# Patient Record
Sex: Male | Born: 1960 | Race: White | Hispanic: No | Marital: Married | Smoking: Former smoker
Health system: Southern US, Community
[De-identification: ages and names within clinical notes are randomized; demographics above are authoritative.]

## PROBLEM LIST (undated history)

## (undated) DIAGNOSIS — J628 Pneumoconiosis due to other dust containing silica: Secondary | ICD-10-CM

---

## 2018-08-17 ENCOUNTER — Encounter (HOSPITAL_COMMUNITY): Payer: Self-pay | Admitting: Emergency Medicine

## 2018-08-17 ENCOUNTER — Inpatient Hospital Stay (HOSPITAL_COMMUNITY): Payer: 59

## 2018-08-17 ENCOUNTER — Emergency Department (HOSPITAL_COMMUNITY): Payer: 59

## 2018-08-17 ENCOUNTER — Inpatient Hospital Stay (HOSPITAL_COMMUNITY)
Admission: EM | Admit: 2018-08-17 | Discharge: 2018-09-12 | DRG: 208 | Disposition: E | Payer: 59 | Attending: Pulmonary Disease | Admitting: Pulmonary Disease

## 2018-08-17 ENCOUNTER — Other Ambulatory Visit: Payer: Self-pay

## 2018-08-17 DIAGNOSIS — R0603 Acute respiratory distress: Secondary | ICD-10-CM

## 2018-08-17 DIAGNOSIS — J45909 Unspecified asthma, uncomplicated: Secondary | ICD-10-CM | POA: Diagnosis present

## 2018-08-17 DIAGNOSIS — Z7952 Long term (current) use of systemic steroids: Secondary | ICD-10-CM

## 2018-08-17 DIAGNOSIS — R579 Shock, unspecified: Secondary | ICD-10-CM | POA: Diagnosis present

## 2018-08-17 DIAGNOSIS — R000 Tachycardia, unspecified: Secondary | ICD-10-CM | POA: Diagnosis present

## 2018-08-17 DIAGNOSIS — Z7951 Long term (current) use of inhaled steroids: Secondary | ICD-10-CM | POA: Diagnosis not present

## 2018-08-17 DIAGNOSIS — Z4659 Encounter for fitting and adjustment of other gastrointestinal appliance and device: Secondary | ICD-10-CM

## 2018-08-17 DIAGNOSIS — Z66 Do not resuscitate: Secondary | ICD-10-CM | POA: Diagnosis present

## 2018-08-17 DIAGNOSIS — Z515 Encounter for palliative care: Secondary | ICD-10-CM | POA: Diagnosis present

## 2018-08-17 DIAGNOSIS — Z87891 Personal history of nicotine dependence: Secondary | ICD-10-CM | POA: Diagnosis not present

## 2018-08-17 DIAGNOSIS — J628 Pneumoconiosis due to other dust containing silica: Secondary | ICD-10-CM | POA: Diagnosis present

## 2018-08-17 DIAGNOSIS — Z79899 Other long term (current) drug therapy: Secondary | ICD-10-CM | POA: Diagnosis not present

## 2018-08-17 DIAGNOSIS — I10 Essential (primary) hypertension: Secondary | ICD-10-CM | POA: Diagnosis present

## 2018-08-17 DIAGNOSIS — J189 Pneumonia, unspecified organism: Secondary | ICD-10-CM | POA: Diagnosis present

## 2018-08-17 DIAGNOSIS — R0602 Shortness of breath: Secondary | ICD-10-CM | POA: Diagnosis present

## 2018-08-17 DIAGNOSIS — Z95828 Presence of other vascular implants and grafts: Secondary | ICD-10-CM

## 2018-08-17 DIAGNOSIS — D62 Acute posthemorrhagic anemia: Secondary | ICD-10-CM | POA: Diagnosis present

## 2018-08-17 DIAGNOSIS — J9601 Acute respiratory failure with hypoxia: Secondary | ICD-10-CM

## 2018-08-17 DIAGNOSIS — K219 Gastro-esophageal reflux disease without esophagitis: Secondary | ICD-10-CM | POA: Diagnosis present

## 2018-08-17 DIAGNOSIS — N179 Acute kidney failure, unspecified: Secondary | ICD-10-CM | POA: Diagnosis present

## 2018-08-17 DIAGNOSIS — R042 Hemoptysis: Secondary | ICD-10-CM | POA: Diagnosis present

## 2018-08-17 HISTORY — DX: Pneumoconiosis due to other dust containing silica: J62.8

## 2018-08-17 LAB — PROTIME-INR
INR: 1.24
Prothrombin Time: 15.5 seconds — ABNORMAL HIGH (ref 11.4–15.2)

## 2018-08-17 LAB — BLOOD GAS, ARTERIAL
ACID-BASE EXCESS: 3.9 mmol/L — AB (ref 0.0–2.0)
Acid-base deficit: 1.9 mmol/L (ref 0.0–2.0)
BICARBONATE: 24.6 mmol/L (ref 20.0–28.0)
Bicarbonate: 27.2 mmol/L (ref 20.0–28.0)
DRAWN BY: 441261
Drawn by: 235321
FIO2: 100
LHR: 16 {breaths}/min
O2 Content: 6 L/min
O2 SAT: 85.7 %
O2 Saturation: 90.7 %
PATIENT TEMPERATURE: 98.6
PEEP: 5 cmH2O
PH ART: 7.48 — AB (ref 7.350–7.450)
Patient temperature: 98.6
VT: 510 mL
pCO2 arterial: 36.9 mmHg (ref 32.0–48.0)
pCO2 arterial: 55.2 mmHg — ABNORMAL HIGH (ref 32.0–48.0)
pH, Arterial: 7.271 — ABNORMAL LOW (ref 7.350–7.450)
pO2, Arterial: 58.8 mmHg — ABNORMAL LOW (ref 83.0–108.0)
pO2, Arterial: 58.9 mmHg — ABNORMAL LOW (ref 83.0–108.0)

## 2018-08-17 LAB — COMPREHENSIVE METABOLIC PANEL
ALT: 31 U/L (ref 0–44)
AST: 31 U/L (ref 15–41)
Albumin: 2.2 g/dL — ABNORMAL LOW (ref 3.5–5.0)
Alkaline Phosphatase: 81 U/L (ref 38–126)
Anion gap: 10 (ref 5–15)
BUN: 30 mg/dL — AB (ref 6–20)
CHLORIDE: 106 mmol/L (ref 98–111)
CO2: 23 mmol/L (ref 22–32)
CREATININE: 2.29 mg/dL — AB (ref 0.61–1.24)
Calcium: 7.1 mg/dL — ABNORMAL LOW (ref 8.9–10.3)
GFR calc Af Amer: 34 mL/min — ABNORMAL LOW (ref >60)
GFR, EST NON AFRICAN AMERICAN: 30 mL/min — AB (ref >60)
Glucose, Bld: 113 mg/dL — ABNORMAL HIGH (ref 70–99)
POTASSIUM: 3.9 mmol/L (ref 3.5–5.1)
Sodium: 139 mmol/L (ref 135–145)
TOTAL PROTEIN: 5.5 g/dL — AB (ref 6.5–8.1)
Total Bilirubin: 1.7 mg/dL — ABNORMAL HIGH (ref 0.3–1.2)

## 2018-08-17 LAB — GLUCOSE, CAPILLARY: GLUCOSE-CAPILLARY: 74 mg/dL (ref 70–99)

## 2018-08-17 LAB — CBC WITH DIFFERENTIAL/PLATELET
ABS IMMATURE GRANULOCYTES: 0.47 10*3/uL — AB (ref 0.00–0.07)
BASOS ABS: 0.1 10*3/uL (ref 0.0–0.1)
Basophils Relative: 0 %
Eosinophils Absolute: 0.1 10*3/uL (ref 0.0–0.5)
Eosinophils Relative: 0 %
HEMATOCRIT: 25 % — AB (ref 39.0–52.0)
HEMOGLOBIN: 7.4 g/dL — AB (ref 13.0–17.0)
IMMATURE GRANULOCYTES: 2 %
LYMPHS ABS: 1.9 10*3/uL (ref 0.7–4.0)
LYMPHS PCT: 9 %
MCH: 26.4 pg (ref 26.0–34.0)
MCHC: 29.6 g/dL — ABNORMAL LOW (ref 30.0–36.0)
MCV: 89.3 fL (ref 80.0–100.0)
Monocytes Absolute: 1.9 10*3/uL — ABNORMAL HIGH (ref 0.1–1.0)
Monocytes Relative: 9 %
NEUTROS PCT: 80 %
NRBC: 0 % (ref 0.0–0.2)
Neutro Abs: 16.5 10*3/uL — ABNORMAL HIGH (ref 1.7–7.7)
Platelets: 280 10*3/uL (ref 150–400)
RBC: 2.8 MIL/uL — ABNORMAL LOW (ref 4.22–5.81)
RDW: 14.7 % (ref 11.5–15.5)
WBC: 20.9 10*3/uL — ABNORMAL HIGH (ref 4.0–10.5)

## 2018-08-17 LAB — CBC
HCT: 34.7 % — ABNORMAL LOW (ref 39.0–52.0)
Hemoglobin: 10.5 g/dL — ABNORMAL LOW (ref 13.0–17.0)
MCH: 26.4 pg (ref 26.0–34.0)
MCHC: 30.3 g/dL (ref 30.0–36.0)
MCV: 87.2 fL (ref 80.0–100.0)
PLATELETS: 245 10*3/uL (ref 150–400)
RBC: 3.98 MIL/uL — ABNORMAL LOW (ref 4.22–5.81)
RDW: 14.6 % (ref 11.5–15.5)
WBC: 14.7 10*3/uL — ABNORMAL HIGH (ref 4.0–10.5)
nRBC: 0 % (ref 0.0–0.2)

## 2018-08-17 LAB — TYPE AND SCREEN
ABO/RH(D): B POS
Antibody Screen: NEGATIVE

## 2018-08-17 LAB — MRSA PCR SCREENING: MRSA BY PCR: NEGATIVE

## 2018-08-17 LAB — I-STAT CG4 LACTIC ACID, ED: LACTIC ACID, VENOUS: 1.51 mmol/L (ref 0.5–1.9)

## 2018-08-17 LAB — ABO/RH: ABO/RH(D): B POS

## 2018-08-17 LAB — LACTIC ACID, PLASMA: Lactic Acid, Venous: 1 mmol/L (ref 0.5–1.9)

## 2018-08-17 MED ORDER — MIDAZOLAM HCL 2 MG/2ML IJ SOLN
2.0000 mg | Freq: Once | INTRAMUSCULAR | Status: AC
  Administered 2018-08-17 (×2): 2 mg via INTRAVENOUS

## 2018-08-17 MED ORDER — FENTANYL CITRATE (PF) 100 MCG/2ML IJ SOLN
INTRAMUSCULAR | Status: AC
  Filled 2018-08-17: qty 2

## 2018-08-17 MED ORDER — ALBUTEROL SULFATE (2.5 MG/3ML) 0.083% IN NEBU
10.0000 mg | INHALATION_SOLUTION | Freq: Once | RESPIRATORY_TRACT | Status: AC
  Administered 2018-08-17: 10 mg via RESPIRATORY_TRACT
  Filled 2018-08-17: qty 12

## 2018-08-17 MED ORDER — FENTANYL CITRATE (PF) 100 MCG/2ML IJ SOLN
100.0000 ug | Freq: Once | INTRAMUSCULAR | Status: DC
  Filled 2018-08-17: qty 2

## 2018-08-17 MED ORDER — ASPIRIN 81 MG PO CHEW: 324.0000 mg | CHEWABLE_TABLET | ORAL | Status: DC

## 2018-08-17 MED ORDER — PANTOPRAZOLE SODIUM 40 MG IV SOLR
40.0000 mg | Freq: Every day | INTRAVENOUS | Status: DC
  Administered 2018-08-17 – 2018-08-18 (×2): 40 mg via INTRAVENOUS
  Filled 2018-08-17 (×3): qty 40

## 2018-08-17 MED ORDER — CHLORHEXIDINE GLUCONATE 0.12% ORAL RINSE (MEDLINE KIT)
15.0000 mL | Freq: Two times a day (BID) | OROMUCOSAL | Status: DC
  Administered 2018-08-17 – 2018-08-18 (×3): 15 mL via OROMUCOSAL

## 2018-08-17 MED ORDER — ORAL CARE MOUTH RINSE
15.0000 mL | OROMUCOSAL | Status: DC
  Administered 2018-08-17 – 2018-08-18 (×9): 15 mL via OROMUCOSAL

## 2018-08-17 MED ORDER — SODIUM CHLORIDE 0.9 % IV BOLUS
1000.0000 mL | Freq: Once | INTRAVENOUS | Status: AC
  Administered 2018-08-17: 1000 mL via INTRAVENOUS

## 2018-08-17 MED ORDER — PREDNISONE 5 MG PO TABS
5.0000 mg | ORAL_TABLET | Freq: Every day | ORAL | Status: DC
  Filled 2018-08-17: qty 1

## 2018-08-17 MED ORDER — ASPIRIN 300 MG RE SUPP: 300.0000 mg | RECTAL | Status: DC

## 2018-08-17 MED ORDER — SUCCINYLCHOLINE CHLORIDE 20 MG/ML IJ SOLN
100.0000 mg | Freq: Once | INTRAMUSCULAR | Status: AC
  Administered 2018-08-17: 100 mg via INTRAVENOUS
  Filled 2018-08-17: qty 5

## 2018-08-17 MED ORDER — VANCOMYCIN HCL IN DEXTROSE 1-5 GM/200ML-% IV SOLN
1000.0000 mg | Freq: Once | INTRAVENOUS | Status: DC
  Filled 2018-08-17 (×2): qty 200

## 2018-08-17 MED ORDER — FENTANYL CITRATE (PF) 100 MCG/2ML IJ SOLN
200.0000 ug | Freq: Once | INTRAMUSCULAR | Status: AC
  Administered 2018-08-17: 200 ug via INTRAVENOUS
  Filled 2018-08-17: qty 4

## 2018-08-17 MED ORDER — SODIUM CHLORIDE 0.9 % IV SOLN
2.0000 g | Freq: Once | INTRAVENOUS | Status: AC
  Administered 2018-08-17: 2 g via INTRAVENOUS
  Filled 2018-08-17: qty 2

## 2018-08-17 MED ORDER — FENTANYL 2500MCG IN NS 250ML (10MCG/ML) PREMIX INFUSION
0.0000 ug/h | INTRAVENOUS | Status: DC
  Administered 2018-08-17: 25 ug/h via INTRAVENOUS
  Administered 2018-08-18: 400 ug/h via INTRAVENOUS
  Filled 2018-08-17 (×2): qty 250

## 2018-08-17 MED ORDER — PROPOFOL 1000 MG/100ML IV EMUL
5.0000 ug/kg/min | Freq: Once | INTRAVENOUS | Status: AC
  Administered 2018-08-17: 5 ug/kg/min via INTRAVENOUS
  Filled 2018-08-17: qty 100

## 2018-08-17 MED ORDER — SODIUM CHLORIDE 0.9 % IV SOLN
2.0000 g | Freq: Three times a day (TID) | INTRAVENOUS | Status: DC
  Administered 2018-08-18: 2 g via INTRAVENOUS
  Filled 2018-08-17 (×4): qty 2

## 2018-08-17 MED ORDER — SODIUM CHLORIDE 0.9 % IV SOLN
INTRAVENOUS | Status: DC
  Administered 2018-08-17: via INTRAVENOUS

## 2018-08-17 MED ORDER — NOREPINEPHRINE 4 MG/250ML-% IV SOLN
0.0000 ug/min | INTRAVENOUS | Status: DC
  Administered 2018-08-17: 16 ug/min via INTRAVENOUS
  Administered 2018-08-17 – 2018-08-18 (×3): 10 ug/min via INTRAVENOUS
  Filled 2018-08-17 (×2): qty 250

## 2018-08-17 MED ORDER — MIDAZOLAM HCL 2 MG/2ML IJ SOLN
INTRAMUSCULAR | Status: AC
  Administered 2018-08-17: 2 mg
  Filled 2018-08-17: qty 2

## 2018-08-17 MED ORDER — MIDAZOLAM HCL 2 MG/2ML IJ SOLN
INTRAMUSCULAR | Status: AC
  Administered 2018-08-17: 2 mg via INTRAVENOUS
  Filled 2018-08-17: qty 2

## 2018-08-17 MED ORDER — HYDROGEN PEROXIDE 3 % EX SOLN
CUTANEOUS | Status: AC
  Filled 2018-08-17: qty 473

## 2018-08-17 MED ORDER — LACTATED RINGERS IV BOLUS
1000.0000 mL | Freq: Once | INTRAVENOUS | Status: AC
  Administered 2018-08-17: 1000 mL via INTRAVENOUS

## 2018-08-17 MED ORDER — ETOMIDATE 2 MG/ML IV SOLN
0.3000 mg/kg | Freq: Once | INTRAVENOUS | Status: AC
  Administered 2018-08-17: 20 mg via INTRAVENOUS
  Filled 2018-08-17: qty 20

## 2018-08-17 MED ORDER — PROPOFOL 1000 MG/100ML IV EMUL
INTRAVENOUS | Status: AC
  Administered 2018-08-17: 75 ug/kg/min
  Filled 2018-08-17: qty 100

## 2018-08-17 MED ORDER — FENTANYL BOLUS VIA INFUSION
30.0000 ug | Freq: Once | INTRAVENOUS | Status: AC
  Administered 2018-08-17: 30 ug via INTRAVENOUS
  Filled 2018-08-17: qty 30

## 2018-08-17 MED ORDER — BUDESONIDE 0.25 MG/2ML IN SUSP
0.2500 mg | Freq: Two times a day (BID) | RESPIRATORY_TRACT | Status: DC
  Administered 2018-08-18 (×2): 0.25 mg via RESPIRATORY_TRACT
  Filled 2018-08-17 (×3): qty 2

## 2018-08-17 NOTE — ED Notes (Addendum)
Pt to be intubated at this time by Dr Mechele Collin ICU MD. RRT x 2 at the bedside, ED Provider [place] at bedside

## 2018-08-17 NOTE — H&P (Addendum)
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  At baseline, he has chronic dyspnea due to his silicosis and is on chronic prednisone and bronchodilators.  Is followed by a pulmonologist in [place].  Three weeks ago, he presented to the OSH ED with similar symptoms with intermittent small streaks of sputum for which he was prescribed Augmentin and prednisone 60 mg x 5 days.  His symptoms improved with treatment however he to need to have persistent hemoptysis.  In the last 3 days, he has had fatigue, chills, dizziness and increased volume of bright red bloody sputum, being able to fill up half a cup over 2 hours.  In the ED he was found with tachycardia, tachypnea, and new onset hypoxemia.  He was placed on 6 L O2 via [place].  Pulmonary/critical care consulted for admission for massive hemoptysis.  Past Medical History  Silicosis, asthma, reflux, obstructive sleep apnea Significant Hospital Events   09/03/2018- Admitted to ICU for bronchoscopy  Consults: date of consult/date signed off & final recs:  Pulmonary 11/5  Procedures (surgical and bedside):  Intubation 11/5>>> Bronchoscopy 11/5 Significant Diagnostic Tests:  Bronchoscopy 11/5: Airway inspection performed.  All airways patent.  Mucosa normal.  No evidence of blood or bleeding on left side.  Right middle lobe and right lower lobe with bright red bloody secretions, not able to visualize bleeding site.  Secretions are more prominent at the entry of the right middle lobe.  RML and RLL suctioned with evidence of bloody secretions.  BAL of right middle lobe  performed with 60 cc of saline given and 22 cc collected. Micro Data:  BAL of right middle lobe  Antimicrobials:  Vanc 11/5 Cefepime 11/5>>>  Subjective:  He complains of headaches, dizziness, worsening shortness of breath, hemoptysis, and chest pain associated with cough.  Also has nasal congestion, reflux.  Denies nausea, emesis, abdominal pain.  Reports decreased appetite.  Denies weight loss or night sweats.  Reports decreased urinary frequency and darker urine.  No hematuria or hematochezia.  Objective   Blood pressure (!) 101/47, pulse (!) 103, temperature 100.1 F (37.8 C), temperature source Axillary, resp. rate (!) 22, height _0  (1.676 m), weight 86.2 kg, SpO2 96 %.    Vent Mode: PRVC FiO2 (%):  [100 %] 100 % Set Rate:  [16 bmp-20 bmp] 20 bmp Vt Set:  [500 mL-510 mL] 510 mL PEEP:  [5 EOF12-1 cmH20] 8 cmH20 Plateau Pressure:  [25 cmH20-26 cmH20] 25 cmH20   Intake/Output Summary (Last 24 hours) at 09/04/2018 2052 Last data filed at 09/05/2018 1901 Gross per 24 hour  Intake 1115.84 ml  Output -  Net 1115.84 ml   Filed Weights   08/20/2018 1652  Weight: 86.2 kg    Examination: General: Diaphoretic male in moderate respiratory distress HENT: [place], AT, oropharynx moist with streaks of blood,  Eyes:  EOMI, no scleral icterus Lungs: Diffuse rhonchi bilaterally R>L, no wheezing Cardiovascular: Tachycardic, no M/R/G Abdomen: BS+, NTTP Extremities: Non-pitting lower extremity edema bilaterally, distal pulses palpable Neuro: AAO x4, anxious GU: foley in place   Resolved Hospital Problem list     Assessment & Plan:  Massive hemoptysis Patient with recent respiratory infection associated with hemoptysis that has progressed despite antibiotics and steroid burst.  Presented with new onset hypoxemia and worsening hemoptysis.  Patient with history of silicosis and severe asthma.  Bronchoscopy demonstrated persistent bloody secretions in RML and RLL.  Concerned that his recent  respiratory infection may have caused trauma to cause bleeding however no evidence of active bleeding site on exam. Patient typed and screened. Plan --On mechanical ventilation --Consult IR  --Trend CBC --Strict I/Os --Telemetry --NPO  Acute hypoxemic respiratory failure Hypotension AHRF likely secondary to hemoptysis. Cannot rule out infection. Febrile on arrival to ICU. Hypotension likely secondary to sedatives however cannot rule out septic shock  --On mechanical ventilation. Wean ventilator settings as tolerated.  To be remain intubated for pending procedures. --On propofol and fentanyl gtt --Blood cultures x 2 and urine culture --Follow-up BAL cell count and cultures --Broad spectrum antibiotics --CT Chest when stable  Silicosis --Prednisone 5 mg Thomas per patient --Albuterol nebulizer PRN  Asthma On Breo at home --Pulmicort neb BID  Goals of Care Discussed with patient. He requests NO compressions, NO shocks. He is ok with mechanical ventilation, pressors and medications.  Rodman Pickle, M.D. [facility] Pulmonary/Critical Care Medicine Pager: [phone] After hours pager: [phone]   Disposition / Summary of Today's Plan 08/13/2018   As above    Diet: NPO Pain/Anxiety/Delirium protocol (if indicated): Yes VAP protocol (if indicated): Yes DVT prophylaxis: SCDs GI prophylaxis: PPI (Home) Hyperglycemia protocol: N/A Mobility: Defer Code Status: DNR - NO compressions, NO shocks. Ok with mechanical ventilation, pressors and medications. Family Communication: Olegario Shearer (Wife). She is aware of patient's code status  Labs   CBC: Recent Labs  Lab 09/04/2018 1353  WBC 14.7*  HGB 10.5*  HCT 34.7*  MCV 87.2  PLT 299    Basic Metabolic Panel: No results for input(s): NA, K, CL, CO2, GLUCOSE, BUN, CREATININE, CALCIUM, MG, PHOS in the last 168 hours. GFR: CrCl cannot be calculated (No successful lab value found.). Recent Labs  Lab 09/08/2018 1353 08/16/2018 1611  WBC 14.7*   --   LATICACIDVEN  --  1.51    Liver Function Tests: No results for input(s): AST, ALT, ALKPHOS, BILITOT, PROT, ALBUMIN in the last 168 hours. No results for input(s): LIPASE, AMYLASE in the last 168 hours. No results for input(s): AMMONIA in the last 168 hours.  ABG    Component Value Date/Time   PHART 7.271 (L) 08/13/2018 1908   PCO2ART 55.2 (H) 08/28/2018 1908   PO2ART 58.9 (L) 09/10/2018 1908   HCO3 24.6 08/16/2018 1908   ACIDBASEDEF 1.9 08/30/2018 1908   O2SAT 85.7 09/02/2018 1908     Coagulation Profile: Recent Labs  Lab 08/23/2018 1353  INR 1.24    Cardiac Enzymes: No results for input(s): CKTOTAL, CKMB, CKMBINDEX, TROPONINI in the last 168 hours.  HbA1C: No results found for: HGBA1C  CBG: No results for input(s): GLUCAP in the last 168 hours.   Review of Systems:   Review of Systems  Constitutional: Positive for diaphoresis and malaise/fatigue. Negative for chills, fever and weight loss.  HENT: Positive for congestion and sore throat. Negative for nosebleeds.   Eyes: Negative for blurred vision.  Respiratory: Positive for cough, hemoptysis, sputum production and shortness of breath. Negative for wheezing.   Cardiovascular: Positive for chest pain, leg swelling and PND. Negative for palpitations and orthopnea.  Gastrointestinal: Positive for heartburn. Negative for abdominal pain, diarrhea, nausea and vomiting.  Genitourinary: Positive for frequency.  Musculoskeletal: Negative for  myalgias.  Skin: Negative for rash.  Neurological: Positive for dizziness, sensory change (right lower foot numbness) and headaches. Negative for focal weakness, loss of consciousness and weakness.  Endo/Heme/Allergies: Does not bruise/bleed easily.  Psychiatric/Behavioral: The patient is nervous/anxious.     Past Medical History  He,  has a past medical history of Silicosis ([place]).   Surgical History   History reviewed. No pertinent surgical history.   Social History    Social History   Socioeconomic History  . Marital status: Married    Spouse name: Not on file  . Number of children: Not on file  . Years of education: Not on file  . Highest education level: Not on file  Occupational History  . Not on file  Social Needs  . Financial resource strain: Not on file  . Food insecurity:    Worry: Not on file    Inability: Not on file  . Transportation needs:    Medical: Not on file    Non-medical: Not on file  Tobacco Use  . Smoking status: Former Research scientist (life sciences)  . Smokeless tobacco: Never Used  Substance and Sexual Activity  . Alcohol use: Yes    Comment: Social  . Drug use: Not on file  . Sexual activity: Not on file  Lifestyle  . Physical activity:    Days per week: Not on file    Minutes per session: Not on file  . Stress: Not on file  Relationships  . Social connections:    Talks on phone: Not on file    Gets together: Not on file    Attends religious service: Not on file    Active member of club or organization: Not on file    Attends meetings of clubs or organizations: Not on file    Relationship status: Not on file  . Intimate partner violence:    Fear of current or ex partner: Not on file    Emotionally abused: Not on file    Physically abused: Not on file    Forced sexual activity: Not on file  Other Topics Concern  . Not on file  Social History Narrative  . Not on file  ,  reports that he has quit smoking. He has never used smokeless tobacco. He reports that he drinks alcohol.   Family History   His family history includes Alzheimer's disease in his father; Diabetes in his mother.   Allergies No Known Allergies   Home Medications  Prior to Admission medications   Medication Sig Start Date End Date Taking? Authorizing Provider  albuterol (PROVENTIL) (2.5 MG/3ML) 0.083% nebulizer solution Take 3 mLs by nebulization every 6 (six) hours as needed. 07/24/18  Yes [provider]  BREO ELLIPTA 200-25 MCG/INH AEPB Inhale  1 puff into the lungs Thomas. 08/07/18  Yes [provider]  fluticasone (FLONASE) 50 MCG/ACT nasal spray Place 1 spray into both nostrils Thomas. 08/06/18  Yes [provider]  pantoprazole (PROTONIX) 40 MG tablet Take 40 mg by mouth 2 (two) times Thomas. 07/24/18  Yes [provider]  VENTOLIN HFA 108 (90 Base) MCG/ACT inhaler Inhale 1-2 puffs into the lungs every 4 (four) hours as needed. 08/07/18  Yes [provider]    The patient is critically ill with multiple organ systems failure and requires high complexity decision making for assessment and support, frequent evaluation and titration of therapies, application of advanced monitoring technologies and extensive interpretation of multiple databases.   Critical Care Time devoted to patient care  services described in this note is  120 Minutes. This time reflects time of care of this signee Dr. Rodman Pickle. This critical care time does not reflect procedure time, or teaching time or supervisory time of PA/NP/Med student/Med Resident etc but could involve care discussion time.  Rodman Pickle, M.D. [facility] Pulmonary/Critical Care Medicine. Pager: [phone]. After hours pager: [phone].

## 2018-08-17 NOTE — ED Notes (Signed)
Bed: ZO10 Expected date:  Expected time:  Means of arrival:  Comments: EMS bloody cough

## 2018-08-17 NOTE — ED Provider Notes (Addendum)
[facility]-EMERGENCY DEPT Provider Note   CSN: [id number] Arrival date & time: 31-Aug-2018  1229     History   Chief Complaint Chief Complaint  Patient presents with  . Shortness of Breath    HPI Jacob Thomas is a 57 y.o. male.  HPI 57 yo male with a hx of interstitial lung disease and silica based lung disease presenting with 3 days of worsening SOB and frank hemoptysis. Initially began 3 weeks ago but was treated with augmentin and steroids with some initial improvement. Now worsening. No use of anticoagulants. No hx of cancer. No hx of tobacco abuse. Has a pulmonologist in [place] where he resides. Trying his albuterol at home without improvement in his symptoms. Some chills at home.    History reviewed. No pertinent past medical history.  There are no active problems to display for this patient.   History reviewed. No pertinent surgical history.      Home Medications    Prior to Admission medications   Medication Sig Start Date End Date Taking? Authorizing Provider  albuterol (PROVENTIL) (2.5 MG/3ML) 0.083% nebulizer solution Take 3 mLs by nebulization every 6 (six) hours as needed. 07/24/18   [provider]  amLODipine (NORVASC) 5 MG tablet Take 5 mg by mouth daily. 06/25/18   [provider]  amoxicillin-clavulanate (AUGMENTIN) 875-125 MG tablet Take 1 tablet by mouth every 12 (twelve) hours. 07/31/18   [provider]  BREO ELLIPTA 200-25 MCG/INH AEPB Inhale 1 puff into the lungs daily. 08/07/18   [provider]  fluticasone (FLONASE) 50 MCG/ACT nasal spray Place 1 spray into both nostrils daily. 08/06/18   [provider]  pantoprazole (PROTONIX) 40 MG tablet Take 40 mg by mouth 2 (two) times daily. 07/24/18   [provider]  VENTOLIN HFA 108 (90 Base) MCG/ACT inhaler Inhale 1-2 puffs into the lungs every 4 (four) hours as needed. 08/07/18   [provider]    Family  History History reviewed. No pertinent family history.  Social History Social History   Tobacco Use  . Smoking status: Former Games developer  . Smokeless tobacco: Never Used  Substance Use Topics  . Alcohol use: Not on file  . Drug use: Not on file     Allergies   Patient has no known allergies.   Review of Systems Review of Systems  All other systems reviewed and are negative.    Physical Exam Updated Vital Signs BP 135/81   Pulse (!) 137   Temp 98.9 F (37.2 C) (Oral)   Resp (!) 28   SpO2 95%   Physical Exam  Constitutional: He is oriented to person, place, and time. He appears well-developed and well-nourished.  HENT:  Head: Normocephalic and atraumatic.  Eyes: EOM are normal.  Neck: Normal range of motion.  Cardiovascular: Regular rhythm, normal heart sounds and intact distal pulses.  tachycardia  Pulmonary/Chest: He is in respiratory distress. He has no wheezes.  Rhonchi throughout. tacypnea  Abdominal: Soft. He exhibits no distension. There is no tenderness.  Musculoskeletal: Normal range of motion.  Neurological: He is alert and oriented to person, place, and time.  Skin: Skin is warm and dry.  Psychiatric: He has a normal mood and affect. Judgment normal.  Nursing note and vitals reviewed.    ED Treatments / Results  Labs (all labs ordered are listed, but only abnormal results are displayed) Labs Reviewed  CBC - Abnormal; Notable for the following components:      Result  Value   WBC 14.7 (*)    RBC 3.98 (*)    Hemoglobin 10.5 (*)    HCT 34.7 (*)    All other components within normal limits  PROTIME-INR - Abnormal; Notable for the following components:   Prothrombin Time 15.5 (*)    All other components within normal limits  CULTURE, BLOOD (ROUTINE X 2)  CULTURE, BLOOD (ROUTINE X 2)  BLOOD GAS, ARTERIAL  I-STAT CG4 LACTIC ACID, ED  TYPE AND SCREEN    EKG EKG Interpretation  Date/Time:  Tuesday August 17 2018 13:48:02 EST Ventricular Rate:   134 PR Interval:    QRS Duration: 82 QT Interval:  291 QTC Calculation: 435 R Axis:   46 Text Interpretation:  Sinus tachycardia Left atrial enlargement No old tracing to compare Confirmed by Azalia Bilis ([id number]) on 08/26/2018 3:17:57 PM   Radiology Dg Chest Portable 1 View  Result Date: 08/26/2018 CLINICAL DATA:  Shortness of breath EXAM: PORTABLE CHEST 1 VIEW COMPARISON:  None. FINDINGS: The heart size and mediastinal contours are within normal limits. Consolidation of bilateral lung bases are noted. No pleural effusion is noted. There is no pulmonary edema. The visualized skeletal structures are unremarkable. IMPRESSION: Bibasilar pneumonias. Electronically Signed   By: Sherian Rein M.D.   On: 08/25/2018 15:13    Procedures .Critical Care Performed by: Azalia Bilis, MD Authorized by: Azalia Bilis, MD    CRITICAL CARE Performed by: Azalia Bilis Total critical care time: 35 minutes Critical care time was exclusive of separately billable procedures and treating other patients. Critical care was necessary to treat or prevent imminent or life-threatening deterioration. Critical care was time spent personally by me on the following activities: development of treatment plan with patient and/or surrogate as well as nursing, discussions with consultants, evaluation of patient's response to treatment, examination of patient, obtaining history from patient or surrogate, ordering and performing treatments and interventions, ordering and review of laboratory studies, ordering and review of radiographic studies, pulse oximetry and re-evaluation of patient's condition.    INTUBATION Required items: required blood products, implants, devices, and special equipment available Patient identity confirmed: provided demographic data and hospital-assigned identification number Time out: Immediately prior to procedure a "time out" was called to verify the correct patient, procedure, equipment,  support staff and site/side marked as required. Indications: acute respiratory failure Intubation method: Glidescope Laryngoscopy  Preoxygenation: BVM Sedatives: Etomidate Paralytic: Succinylcholine Tube Size: 8-0 cuffed Post-procedure assessment: chest rise and ETCO2 monitor Breath sounds: equal and absent over the epigastrium Tube secured with: ETT holder Chest x-ray interpreted by radiologist and me. Chest x-ray findings: endotracheal tube in appropriate position Patient tolerated the procedure well with no immediate complications.       Medications Ordered in ED Medications  sodium chloride 0.9 % bolus 1,000 mL (has no administration in time range)  albuterol (PROVENTIL) (2.5 MG/3ML) 0.083% nebulizer solution 10 mg (has no administration in time range)  vancomycin (VANCOCIN) IVPB 1000 mg/200 mL premix (has no administration in time range)  ceFEPIme (MAXIPIME) 2 g in sodium chloride 0.9 % 100 mL IVPB (has no administration in time range)     Initial Impression / Assessment and Plan / ED Course  I have reviewed the triage vital signs and the nursing notes.  Pertinent labs & imaging results that were available during my care of the patient were reviewed by me and considered in my medical decision making (see chart for details).     Failed outpatient treatment. PNA in the setting of  ILD. Hypoxia requiring 5 liters [place]. abx now. Blood cultures now. Advanced lung disease. PCCM consultation in the ER for pulmonary reasons in addition to likely need for ICU placement. Blood cultures obtained. Fluids now for likely volume depletion. Bronchodilators now. May benefit from CT imaging. Will defer to PCCM. ?bronchoscopy during this hospitalization. No indication for emergent bronch at this time  Final Clinical Impressions(s) / ED Diagnoses   Final diagnoses:  None    ED Discharge Orders    None       Azalia Bilis, MD 09/11/2018 1521    Azalia Bilis, MD 08/18/18 [phone]

## 2018-08-17 NOTE — Progress Notes (Signed)
Late entry- pt received from ED after emergent intubation at 1800. On arrival to unit pt sedated on Propofol and on Levophed drip, NS infusing wide open.  Dr Everardo All in stat bronch done at bedside.  Fent. Drip started at 25 mcg/ kg/min for sedation for bronch.  Washing done .

## 2018-08-17 NOTE — ED Notes (Signed)
TOTAL OF 2, 2MG  DOSES OF VERSED GIVEN FENTANYL

## 2018-08-17 NOTE — ED Triage Notes (Addendum)
Patient arrived by EMS from Urgent Care. Pt c/o SOB. Pt has been coughing up blood for three days which started three weeks ago then went away. Pt has hx of cilicosis and asthma.   Pt only complains of pain when coughing.   Pt is actively coughing up blood per EMS. Pt alert and oriented x4.  Pt is on a non-rebreather.  HR 140, SpO2 97%, BP 136/84.

## 2018-08-17 NOTE — Procedures (Signed)
PCCM Video Bronchoscopy Procedure Note  The patient was informed of the risks (including but not limited to bleeding, infection, respiratory failure, lung injury, tooth/oral injury) and benefits of the procedure and gave consent, see chart.  Indication: Massive Hemoptysis  Post Procedure Diagnosis: Massive Hemoptysis  Location: [facility] ICU  Condition pre procedure: Critical  Medications for procedure: Fentanyl and Propofol gtt  Procedure description: The bronchoscope was introduced through the endotracheal tube and passed to the bilateral lungs to the level of the subsegmental bronchi throughout the tracheobronchial tree.  Airway exam revealed:  All airways patent.  Mucosa normal.  No evidence of blood or bleeding on left side.  Right middle lobe and right lower lobe with bright red bloody secretions, not able to visualize bleeding site.  Secretions are more prominent at the entry of the right middle lobe.  RML and RLL suctioned with evidence of bloody secretions.  BAL of right middle lobe performed with 60 cc of saline given and 22 cc collected. After airways suctioned and cleared, bronchoscopy was slowly removed.  Procedures performed: Diagnostic bronchoscopy with bal  Specimens sent: cell count, respiratory culture, fungal culture, AFB  Condition post procedure: Critical  EBL: 38LH  Complications: During procedure, patient had hypotension with SBP 60s. Bronchoscope was removed and IV fluid bolus and Levophed was started. Pressures normalized after 5 minutes and bronchoscope was re-introduced. Pressures remained adequate for remainder of procedure and levophed was weaned to 54mg.

## 2018-08-17 NOTE — ED Notes (Signed)
ED TO INPATIENT HANDOFF REPORT  Name/Age/Gender Jacob Thomas 57 y.o. male  Code Status    Code Status Orders  (From admission, onward)         Start     Ordered   08/16/2018 1717  Full code  Continuous     09/09/2018 1716        Code Status History    This patient has a current code status but no historical code status.      Home/SNF/Other Home  Chief Complaint short of breath   Level of Care/Admitting Diagnosis ED Disposition    ED Disposition Condition Comment   Admit  Hospital Area: [facility] [[id number]]  Level of Care: ICU [6]  Diagnosis: Massive hemoptysis [730442]  Admitting Physician: Everardo All [facility] [[id number]]  Attending Physician: Luciano Cutter [[id number]]  Estimated length of stay: 5 - 7 days  Certification:: I certify this patient will need inpatient services for at least 2 midnights  PT Class (Do Not Modify): Inpatient [101]  PT Acc Code (Do Not Modify): Private [1]       Medical History History reviewed. No pertinent past medical history.  Allergies No Known Allergies  IV Location/Drains/Wounds Patient Lines/Drains/Airways Status   Active Line/Drains/Airways    Name:   Placement date:   Placement time:   Site:   Days:   Peripheral IV 09/03/2018 Left Antecubital   08/16/2018    1613    Antecubital   less than 1   Airway 8 mm   09/11/2018    1705     less than 1          Labs/Imaging Results for orders placed or performed during the hospital encounter of 09/03/2018 (from the past 48 hour(s))  Type and screen [facility]     Status: None (Preliminary result)   Collection Time: 09/05/2018  1:49 PM  Result Value Ref Range   ABO/RH(D) PENDING    Antibody Screen PENDING    Sample Expiration      08/20/2018 Performed at [facility], [address], [place], [place] [address]   CBC     Status: Abnormal   Collection Time: 08/28/2018  1:53 PM  Result Value Ref Range   WBC 14.7 (H) 4.0 - 10.5  K/uL   RBC 3.98 (L) 4.22 - 5.81 MIL/uL   Hemoglobin 10.5 (L) 13.0 - 17.0 g/dL   HCT 82.9 (L) 56.2 - 13.0 %   MCV 87.2 80.0 - 100.0 fL   MCH 26.4 26.0 - 34.0 pg   MCHC 30.3 30.0 - 36.0 g/dL   RDW 86.5 78.4 - 69.6 %   Platelets 245 150 - 400 K/uL   nRBC 0.0 0.0 - 0.2 %    Comment: Performed at [facility], [address], [place], [place] [address]  Protime-INR     Status: Abnormal   Collection Time: 08/21/2018  1:53 PM  Result Value Ref Range   Prothrombin Time 15.5 (H) 11.4 - 15.2 seconds   INR 1.24     Comment: Performed at [facility], [address] [place], [place] [address]  Blood gas, arterial [facility] & [facility] ONLY)     Status: Abnormal   Collection Time: 08/16/2018  3:18 PM  Result Value Ref Range   O2 Content 6.0 L/min   Delivery systems NASAL CANNULA    pH, Arterial 7.480 (H) 7.350 - 7.450   pCO2 arterial 36.9 32.0 - 48.0 mmHg  pO2, Arterial 58.8 (L) 83.0 - 108.0 mmHg   Bicarbonate 27.2 20.0 - 28.0 mmol/L   Acid-Base Excess 3.9 (H) 0.0 - 2.0 mmol/L   O2 Saturation 90.7 %   Patient temperature 98.6    Collection site LEFT RADIAL    Drawn by [id number]    Sample type ARTERIAL DRAW    Allens test (pass/fail) PASS PASS    Comment: Performed at [facility], [address] [place], [place] [address]  I-Stat CG4 Lactic Acid, ED     Status: None   Collection Time: 09/10/18  4:11 PM  Result Value Ref Range   Lactic Acid, Venous 1.51 0.5 - 1.9 mmol/L   Dg Chest Portable 1 View  Result Date: 09-10-2018 CLINICAL DATA:  Shortness of breath EXAM: PORTABLE CHEST 1 VIEW COMPARISON:  None. FINDINGS: The heart size and mediastinal contours are within normal limits. Consolidation of bilateral lung bases are noted. No pleural effusion is noted. There is no pulmonary edema. The visualized skeletal structures are unremarkable. IMPRESSION: Bibasilar pneumonias. Electronically Signed   By: Sherian Rein M.D.   On: 2018/09/10 15:13    Pending  Labs Unresulted Labs (From admission, onward)    Start     Ordered   09/10/18 1715  HIV antibody (Routine Testing)  Once,   R     09-10-2018 1716   September 10, 2018 1508  Blood culture (routine x 2)  BLOOD CULTURE X 2,   STAT     Sep 10, 2018 1508          Vitals/Pain Today's Vitals   September 10, 2018 1700 Sep 10, 2018 1701 September 10, 2018 1720 09-10-18 1730  BP: (!) 153/85 (!) 153/85 (!) 167/96 104/70  Pulse: (!) 135 (!) 137 (!) 153 (!) 139  Resp: (!) 27 (!) 21 (!) 42 20  Temp:      TempSrc:      SpO2: 98% 99% 96% 96%  Weight:      PainSc:        Isolation Precautions No active isolations  Medications Medications  vancomycin (VANCOCIN) IVPB 1000 mg/200 mL premix (has no administration in time range)  aspirin chewable tablet 324 mg (has no administration in time range)    Or  aspirin suppository 300 mg (has no administration in time range)  fentaNYL (SUBLIMAZE) injection 100 mcg (has no administration in time range)  fentaNYL in NS (23mcg/ml) infusion-PREMIX (has no administration in time range)  sodium chloride 0.9 % bolus 1,000 mL (1,000 mLs Intravenous New Bag/Given 09/10/2018 1615)  albuterol (PROVENTIL) (2.5 MG/3ML) 0.083% nebulizer solution 10 mg (10 mg Nebulization Given 2018-09-10 1522)  ceFEPIme (MAXIPIME) 2 g in sodium chloride 0.9 % 100 mL IVPB ( Intravenous Paused 09/10/2018 1624)  etomidate (AMIDATE) injection 25.86 mg (20 mg Intravenous Given 09-10-18 1703)  succinylcholine (ANECTINE) injection 100 mg (100 mg Intravenous Given September 10, 2018 1703)  propofol (DIPRIVAN) 1000 MG/100ML infusion (80 mcg/kg/min  86.2 kg Intravenous Rate/Dose Verify September 10, 2018 1735)  midazolam (VERSED) 2 MG/2ML injection (2 mg  Given September 10, 2018 1724)  fentaNYL (SUBLIMAZE) injection 200 mcg (200 mcg Intravenous Given Sep 10, 2018 1725)  midazolam (VERSED) injection 2 mg (2 mg Intravenous Given 2018/09/10 1729)    Mobility walks

## 2018-08-17 NOTE — ED Notes (Signed)
Bed: WU98 Expected date:  Expected time:  Means of arrival:  Comments: EMS recently d/c for sepsis, increased WBC

## 2018-08-18 ENCOUNTER — Encounter (HOSPITAL_COMMUNITY): Payer: Self-pay | Admitting: Anesthesiology

## 2018-08-18 ENCOUNTER — Inpatient Hospital Stay (HOSPITAL_COMMUNITY): Payer: 59

## 2018-08-18 ENCOUNTER — Encounter (HOSPITAL_COMMUNITY): Payer: Self-pay | Admitting: Radiology

## 2018-08-18 DIAGNOSIS — N179 Acute kidney failure, unspecified: Secondary | ICD-10-CM

## 2018-08-18 DIAGNOSIS — J9601 Acute respiratory failure with hypoxia: Secondary | ICD-10-CM

## 2018-08-18 DIAGNOSIS — R042 Hemoptysis: Secondary | ICD-10-CM

## 2018-08-18 LAB — CBC WITH DIFFERENTIAL/PLATELET
ABS IMMATURE GRANULOCYTES: 0.12 10*3/uL — AB (ref 0.00–0.07)
Basophils Absolute: 0.1 10*3/uL (ref 0.0–0.1)
Basophils Relative: 0 %
Eosinophils Absolute: 0.2 10*3/uL (ref 0.0–0.5)
Eosinophils Relative: 1 %
HCT: 28.4 % — ABNORMAL LOW (ref 39.0–52.0)
Hemoglobin: 8.5 g/dL — ABNORMAL LOW (ref 13.0–17.0)
Immature Granulocytes: 1 %
Lymphocytes Relative: 13 %
Lymphs Abs: 1.8 10*3/uL (ref 0.7–4.0)
MCH: 26.8 pg (ref 26.0–34.0)
MCHC: 29.9 g/dL — ABNORMAL LOW (ref 30.0–36.0)
MCV: 89.6 fL (ref 80.0–100.0)
MONO ABS: 0.8 10*3/uL (ref 0.1–1.0)
Monocytes Relative: 6 %
NEUTROS ABS: 10.7 10*3/uL — AB (ref 1.7–7.7)
NEUTROS PCT: 79 %
Platelets: 217 10*3/uL (ref 150–400)
RBC: 3.17 MIL/uL — AB (ref 4.22–5.81)
RDW: 14.7 % (ref 11.5–15.5)
WBC: 13.6 10*3/uL — AB (ref 4.0–10.5)
nRBC: 0 % (ref 0.0–0.2)

## 2018-08-18 LAB — BASIC METABOLIC PANEL
ANION GAP: 11 (ref 5–15)
Anion gap: 4 — ABNORMAL LOW (ref 5–15)
BUN: 26 mg/dL — AB (ref 6–20)
BUN: 29 mg/dL — ABNORMAL HIGH (ref 6–20)
CHLORIDE: 105 mmol/L (ref 98–111)
CO2: 26 mmol/L (ref 22–32)
CO2: 21 mmol/L — AB (ref 22–32)
CREATININE: 2.21 mg/dL — AB (ref 0.61–1.24)
Calcium: 7.5 mg/dL — ABNORMAL LOW (ref 8.9–10.3)
Calcium: 7.4 mg/dL — ABNORMAL LOW (ref 8.9–10.3)
Chloride: 110 mmol/L (ref 98–111)
Creatinine, Ser: 2.34 mg/dL — ABNORMAL HIGH (ref 0.61–1.24)
GFR calc Af Amer: 36 mL/min — ABNORMAL LOW (ref >60)
GFR calc non Af Amer: 31 mL/min — ABNORMAL LOW (ref >60)
GFR calc non Af Amer: 29 mL/min — ABNORMAL LOW (ref >60)
GFR, EST AFRICAN AMERICAN: 34 mL/min — AB (ref >60)
Glucose, Bld: 89 mg/dL (ref 70–99)
Glucose, Bld: 98 mg/dL (ref 70–99)
POTASSIUM: 4.1 mmol/L (ref 3.5–5.1)
Potassium: 4.7 mmol/L (ref 3.5–5.1)
SODIUM: 137 mmol/L (ref 135–145)
Sodium: 140 mmol/L (ref 135–145)

## 2018-08-18 LAB — POCT I-STAT 3, ART BLOOD GAS (G3+)
ACID-BASE DEFICIT: 7 mmol/L — AB (ref 0.0–2.0)
Acid-base deficit: 6 mmol/L — ABNORMAL HIGH (ref 0.0–2.0)
BICARBONATE: 24 mmol/L (ref 20.0–28.0)
Bicarbonate: 21.2 mmol/L (ref 20.0–28.0)
O2 Saturation: 83 %
O2 Saturation: 80 %
PCO2 ART: 75.7 mmHg — AB (ref 32.0–48.0)
PH ART: 7.112 — AB (ref 7.350–7.450)
PH ART: 7.154 — AB (ref 7.350–7.450)
PO2 ART: 68 mmHg — AB (ref 83.0–108.0)
Patient temperature: 99.7
Patient temperature: 37.4
TCO2: 26 mmol/L (ref 22–32)
TCO2: 23 mmol/L (ref 22–32)
pCO2 arterial: 60.6 mmHg — ABNORMAL HIGH (ref 32.0–48.0)
pO2, Arterial: 59 mmHg — ABNORMAL LOW (ref 83.0–108.0)

## 2018-08-18 LAB — ABO/RH: ABO/RH(D): B POS

## 2018-08-18 LAB — PROTIME-INR
INR: 1.39
INR: 1.39
PROTHROMBIN TIME: 16.9 s — AB (ref 11.4–15.2)
PROTHROMBIN TIME: 16.9 s — AB (ref 11.4–15.2)

## 2018-08-18 LAB — TYPE AND SCREEN
ABO/RH(D): B POS
Antibody Screen: NEGATIVE

## 2018-08-18 LAB — GLUCOSE, CAPILLARY
GLUCOSE-CAPILLARY: 92 mg/dL (ref 70–99)
GLUCOSE-CAPILLARY: 63 mg/dL — AB (ref 70–99)
GLUCOSE-CAPILLARY: 104 mg/dL — AB (ref 70–99)
Glucose-Capillary: 73 mg/dL (ref 70–99)

## 2018-08-18 LAB — APTT: APTT: 35 s (ref 24–36)

## 2018-08-18 LAB — HIV ANTIBODY (ROUTINE TESTING W REFLEX): HIV SCREEN 4TH GENERATION: NONREACTIVE

## 2018-08-18 LAB — LACTIC ACID, PLASMA: Lactic Acid, Venous: 0.7 mmol/L (ref 0.5–1.9)

## 2018-08-18 MED ORDER — CISATRACURIUM BOLUS VIA INFUSION: 20.0000 mg | Freq: Once | INTRAVENOUS | Status: DC

## 2018-08-18 MED ORDER — ACETAMINOPHEN 325 MG PO TABS: 650.0000 mg | ORAL_TABLET | Freq: Four times a day (QID) | ORAL | Status: DC | PRN

## 2018-08-18 MED ORDER — MIDAZOLAM BOLUS VIA INFUSION
1.0000 mg | INTRAVENOUS | Status: DC | PRN
  Filled 2018-08-18: qty 2

## 2018-08-18 MED ORDER — VANCOMYCIN HCL 10 G IV SOLR
1250.0000 mg | INTRAVENOUS | Status: DC
  Filled 2018-08-18: qty 1250

## 2018-08-18 MED ORDER — ARTIFICIAL TEARS OPHTHALMIC OINT: 1.0000 "application " | TOPICAL_OINTMENT | Freq: Three times a day (TID) | OPHTHALMIC | Status: DC

## 2018-08-18 MED ORDER — SODIUM CHLORIDE 0.9 % IV BOLUS
1000.0000 mL | Freq: Once | INTRAVENOUS | Status: AC
  Administered 2018-08-18: 1000 mL via INTRAVENOUS

## 2018-08-18 MED ORDER — POLYVINYL ALCOHOL 1.4 % OP SOLN
1.0000 [drp] | Freq: Four times a day (QID) | OPHTHALMIC | Status: DC | PRN
  Filled 2018-08-18: qty 15

## 2018-08-18 MED ORDER — DIPHENHYDRAMINE HCL 50 MG/ML IJ SOLN: 25.0000 mg | INTRAMUSCULAR | Status: DC | PRN

## 2018-08-18 MED ORDER — MIDAZOLAM HCL 2 MG/2ML IJ SOLN: 4.0000 mg | Freq: Once | INTRAMUSCULAR | Status: DC

## 2018-08-18 MED ORDER — MORPHINE BOLUS VIA INFUSION
5.0000 mg | INTRAVENOUS | Status: DC | PRN
  Filled 2018-08-18: qty 5

## 2018-08-18 MED ORDER — SODIUM CHLORIDE 0.9 % IV SOLN
2.0000 mg/h | INTRAVENOUS | Status: DC
  Administered 2018-08-18: 4 mg/h via INTRAVENOUS
  Administered 2018-08-18: 6 mg/h via INTRAVENOUS
  Filled 2018-08-18 (×2): qty 10

## 2018-08-18 MED ORDER — MORPHINE 100MG IN NS 100ML (1MG/ML) PREMIX INFUSION
0.0000 mg/h | INTRAVENOUS | Status: DC
  Administered 2018-08-18: 10 mg/h via INTRAVENOUS
  Filled 2018-08-18: qty 100

## 2018-08-18 MED ORDER — SODIUM CHLORIDE 0.9 % IV SOLN
0.0000 mg/h | INTRAVENOUS | Status: DC
  Administered 2018-08-18: 4 mg/h via INTRAVENOUS
  Filled 2018-08-18: qty 10

## 2018-08-18 MED ORDER — FENTANYL BOLUS VIA INFUSION
50.0000 ug | INTRAVENOUS | Status: DC | PRN
  Filled 2018-08-18: qty 50

## 2018-08-18 MED ORDER — SODIUM CHLORIDE 0.9 % IV SOLN: INTRAVENOUS | Status: DC | PRN

## 2018-08-18 MED ORDER — FENTANYL CITRATE (PF) 100 MCG/2ML IJ SOLN: 100.0000 ug | Freq: Once | INTRAMUSCULAR | Status: DC | PRN

## 2018-08-18 MED ORDER — ACETAMINOPHEN 160 MG/5ML PO SOLN
650.0000 mg | Freq: Four times a day (QID) | ORAL | Status: DC | PRN
  Administered 2018-08-18: 650 mg
  Filled 2018-08-18: qty 20.3

## 2018-08-18 MED ORDER — ALBUTEROL SULFATE (2.5 MG/3ML) 0.083% IN NEBU: 2.5000 mg | INHALATION_SOLUTION | RESPIRATORY_TRACT | Status: DC | PRN

## 2018-08-18 MED ORDER — METHYLPREDNISOLONE SODIUM SUCC 125 MG IJ SOLR
40.0000 mg | Freq: Two times a day (BID) | INTRAMUSCULAR | Status: DC
  Administered 2018-08-18: 40 mg via INTRAVENOUS
  Filled 2018-08-18: qty 2

## 2018-08-18 MED ORDER — NOREPINEPHRINE 4 MG/250ML-% IV SOLN
INTRAVENOUS | Status: AC
  Filled 2018-08-18: qty 250

## 2018-08-18 MED ORDER — MIDAZOLAM HCL 2 MG/2ML IJ SOLN: 2.0000 mg | Freq: Once | INTRAMUSCULAR | Status: DC | PRN

## 2018-08-18 MED ORDER — CISATRACURIUM BESYLATE (PF) 200 MG/20ML IV SOLN
0.2000 mg/kg | Freq: Once | INTRAVENOUS | Status: AC
  Administered 2018-08-18: 20 mg via INTRAVENOUS
  Filled 2018-08-18 (×2): qty 20

## 2018-08-18 MED ORDER — FENTANYL 2500MCG IN NS 250ML (10MCG/ML) PREMIX INFUSION
100.0000 ug/h | INTRAVENOUS | Status: DC
  Administered 2018-08-18: 200 ug/h via INTRAVENOUS
  Filled 2018-08-18: qty 250

## 2018-08-18 MED ORDER — MIDAZOLAM HCL 2 MG/2ML IJ SOLN
INTRAMUSCULAR | Status: AC
  Administered 2018-08-18: 4 mg
  Filled 2018-08-18: qty 4

## 2018-08-18 MED ORDER — GLYCOPYRROLATE 1 MG PO TABS: 1.0000 mg | ORAL_TABLET | ORAL | Status: DC | PRN

## 2018-08-18 MED ORDER — SODIUM CHLORIDE 0.9 % IV SOLN
20.0000 ug | Freq: Once | INTRAVENOUS | Status: AC
  Administered 2018-08-18: 20 ug via INTRAVENOUS
  Filled 2018-08-18: qty 5

## 2018-08-18 MED ORDER — MORPHINE SULFATE (PF) 2 MG/ML IV SOLN: 2.0000 mg | INTRAVENOUS | Status: DC | PRN

## 2018-08-18 MED ORDER — SODIUM CHLORIDE 0.9 % IV SOLN
2.0000 g | INTRAVENOUS | Status: DC
  Filled 2018-08-18: qty 2

## 2018-08-18 MED ORDER — FENTANYL 2500MCG IN NS 250ML (10MCG/ML) PREMIX INFUSION: 25.0000 ug/h | INTRAVENOUS | Status: DC

## 2018-08-18 MED ORDER — ACETAMINOPHEN 650 MG RE SUPP: 650.0000 mg | Freq: Four times a day (QID) | RECTAL | Status: DC | PRN

## 2018-08-18 MED ORDER — DEXTROSE 5 % IV SOLN: INTRAVENOUS | Status: DC

## 2018-08-18 MED ORDER — GLYCOPYRROLATE 0.2 MG/ML IJ SOLN: 0.2000 mg | INTRAMUSCULAR | Status: DC | PRN

## 2018-08-18 MED ORDER — MIDAZOLAM BOLUS VIA INFUSION
2.0000 mg | INTRAVENOUS | Status: DC | PRN
  Filled 2018-08-18: qty 2

## 2018-08-18 MED ORDER — SODIUM CHLORIDE 0.9 % IV SOLN
3.0000 ug/kg/min | INTRAVENOUS | Status: DC
  Filled 2018-08-18: qty 20

## 2018-08-18 MED ORDER — IPRATROPIUM-ALBUTEROL 0.5-2.5 (3) MG/3ML IN SOLN
3.0000 mL | Freq: Four times a day (QID) | RESPIRATORY_TRACT | Status: DC
  Administered 2018-08-18 (×4): 3 mL via RESPIRATORY_TRACT
  Filled 2018-08-18 (×4): qty 3

## 2018-08-18 NOTE — Progress Notes (Signed)
Anesthesiology Progress/Procedure: R bronchial blocker placement  08:41-08:54  Called by Dr. Alvia Grove for assistance with lung isolation in intubated patient with R lung hemorrhage. Dr. Alvia Grove is requesting R lung isolation. Pt is obese, tachycardic HR 140, BP 84/53, O2 sat 90% on 100% vent support. 150cc blood aspirated from ETT.   R bronchial blocker placed easily, with Dr. Alvia Grove assisting with fiberoptic bronchoscopic guidance. Bronchial blocker cuff inflated under direct vision, R lung isolated.     VSS, pt stable.  Sandford Craze, MD  [phone]

## 2018-08-18 NOTE — Procedures (Signed)
Bronchoscopy Procedure Note Jacob Thomas [id number] 06/20/1961  Procedure: Bronchoscopy Indications: Massive hemoptysis  Procedure Details Consent: Risks of procedure as well as the alternatives and risks of each were explained to the (patient/caregiver).  Consent for procedure obtained. Time Out: Verified patient identification, verified procedure, site/side was marked, verified correct patient position, special equipment/implants available, medications/allergies/relevent history reviewed, required imaging and test results available.  Performed  In preparation for procedure, patient was given 100% FiO2 and bronchoscope lubricated. Sedation: Benzodiazepines and Muscle relaxants  Airway entered and the following bronchi were examined: Main trachea    Fresh blood noted coming up from the right mainstem bronchus and spilling over into the left side.  Blood suctioned out of the main trachea.  Unable to advance scope further given severe desaturations. We were able to place a right-sided endobronchial blocker with the help of anesthesia.  Position of balloon in right main stem confirmed with the bronchoscope visualization  Evaluation Hemodynamic Status: Transient hypotension treated with fluid; O2 sats: Persistent desats to 70s Patient's Current Condition: unstable Specimens:  None  The patient is critically ill with multiple organ system failure and requires high complexity decision making for assessment and support, frequent evaluation and titration of therapies, advanced monitoring, review of radiographic studies and interpretation of complex data.   Critical Care Time devoted to patient care services, exclusive of separately billable procedures, described in this note is 35 minutes.   Chilton Greathouse MD [facility] Pager [phone] If no answer or after 3pm call: [phone] 08/18/2018, 9:54 AM

## 2018-08-18 NOTE — Plan of Care (Addendum)
  Interdisciplinary Goals of Care Family Meeting   Date carried out:: 08/18/2018  Location of the meeting: Conference room  Member's involved: Physician, Bedside Registered Nurse and Family Member or next of kin  Durable Power of Attorney or acting medical decision maker:   Wife Jacob Thomas and sister were present  Discussion: We discussed goals of care for [name] .  Per family he has severe interstitial lung disease and a declining course over the past few years with frequent exacerbations, poor quality of life. He told his wife 1 year ago that he would wants to be DNR  Explained to family to stop bleeding he would need IR embolization with high risk of kidney failure.  Even in best case scenario if we were to stop bleeding he would definitely not return back to baseline functional status.  Given his poor quality of life at baseline and previously expressed wishes his wife does not want to proceed with any aggressive care.  We will cancel IR procedure.  Continue endobronchial blocker, supportive care with pressors, ventilator for now.  Wife is in the process of reaching out to the rest of the family.  We will likely withdraw care when family is ready.  Addendum: Wife informed us at 3pm that they want to withdraw care as soon as his step daughter arrives later tonight from [place]. Orders for withdrawal placed.  They will be activated once the family is ready.  Code status: Limited Code or DNR with short term, Mechanical ventilation, Cental IV access and IV vasoactive drugs  Time spent for the meeting: 45 mins  Chilton Greathouse MD [facility] 08/18/2018, 12:28 PM

## 2018-08-18 NOTE — Consult Note (Addendum)
Jacob Thomas       Jacob Thomas [id number]             [phone]        Jacob Thomas [place] Medical Record #[id number] Date of Birth: 05-11-1961  Referring: Dr. Marshell Garfinkel, MD Primary Care: System, Pcp Not In  Chief Complaint:    Chief Complaint  Patient presents with  . Massive hemoptysis, chills, shortness of Breath    History of Present Illness:     Sister at bedside during consultation. According to medical records as patient intubated and sedated, this is a 57 year old male with a past medical history of  asthma and silicosis who presented to [facility] ED with complaints of worsening shortness of breath, chills, and hemoptysis. 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 He is followed by a pulmonologist in [place], where he resides. The hemoptysis apparently started about 3 weeks ago. He was treated with Augmentin and steroids (probable pneumonia) for pulmonary infection and symptoms initially improved but have recently worsened.  Patient has ongoing active bleeding from the right lung. Repeat bronchoscopy perfumed by Dr. Vaughan Browner shows the bleeding is coming from the right mainstem. IR apparently is somewhat reluctant to embolize at this time secondary to elevated creatinine (2.21);however, they are going to evaluate the patient further.  Dr. Prescott Gum has been consulted for consideration of surgical intervention. Per Dr. Prescott Gum, he recommends giving Factor 7 and asking IR to attempt to embolize. The patient's condition is critical at this time.  Current Activity/ Functional Status: Patient was independent with mobility/ambulation, transfers, ADL's, IADL's.    Past Medical History:  Diagnosis Date  . Silicosis ([place])   Asthma  Past Surgical history: Unable to obtain secondary to patient is intubated and sedated.  Social History   Tobacco Use  Smoking Status Former Smoker    Smokeless Tobacco Never Used    Social History   Substance and Sexual Activity  Alcohol Use Not Currently    Allergies: No Known Allergies  Current Facility-Administered Medications  Medication Dose Route Frequency Provider Last Rate Last Dose  . 0.9 %  sodium chloride infusion   Intravenous Continuous Anders Simmonds, MD 100 mL/hr at 08/18/18 0600    . 0.9 %  sodium chloride infusion   Intra-arterial PRN Mannam, Praveen, MD      . acetaminophen (TYLENOL) solution 650 mg  650 mg Per Tube Q6H PRN Anders Simmonds, MD   650 mg at 08/18/18 0152  . albuterol (PROVENTIL) (2.5 MG/3ML) 0.083% nebulizer solution 2.5 mg  2.5 mg Nebulization Q3H PRN Anders Simmonds, MD      . budesonide (PULMICORT) nebulizer solution 0.25 mg  0.25 mg Nebulization BID Margaretha Seeds, MD   0.25 mg at 08/18/18 0853  . [START ON September 03, 2018] ceFEPIme (MAXIPIME) 2 g in sodium chloride 0.9 % 100 mL IVPB  2 g Intravenous Q24H Margaretha Seeds, MD      . chlorhexidine gluconate (MEDLINE KIT) (PERIDEX) 0.12 % solution 15 mL  15 mL Mouth Rinse BID Margaretha Seeds, MD   15 mL at 08/18/18 0815  . fentaNYL (SUBLIMAZE) bolus via infusion 50 mcg  50 mcg Intravenous Q1H PRN Mannam, Praveen, MD      . fentaNYL 2572mg in NS 2534m(1070mml) infusion-PREMIX  25-400 mcg/hr Intravenous Continuous Mannam, Praveen, MD      . ipratropium-albuterol (DUONEB) 0.5-2.5 (3) MG/3ML  nebulizer solution 3 mL  3 mL Nebulization Q6H Anders Simmonds, MD   3 mL at 08/18/18 0853  . MEDLINE mouth rinse  15 mL Mouth Rinse 10 times per day Margaretha Seeds, MD   15 mL at 08/18/18 2458  . midazolam (VERSED) 50 mg in sodium chloride 0.9 % 50 mL (1 mg/mL) infusion  0-10 mg/hr Intravenous Continuous Mannam, Praveen, MD 4 mL/hr at 08/18/18 0912 4 mg/hr at 08/18/18 0912  . midazolam (VERSED) bolus via infusion 1-2 mg  1-2 mg Intravenous Q2H PRN Mannam, Praveen, MD      . midazolam (VERSED) injection 4 mg  4 mg Intravenous Once Mannam, Praveen, MD       . norepinephrine (LEVOPHED) 68m in D5W 2556mpremix infusion  0-40 mcg/min Intravenous Titrated ElMargaretha SeedsMD   Stopped at 08/18/18 0018  . pantoprazole (PROTONIX) injection 40 mg  40 mg Intravenous Daily ElMargaretha SeedsMD   40 mg at 08/26/2018 2112  . predniSONE (DELTASONE) tablet 5 mg  5 mg Oral Q breakfast ElMargaretha SeedsMD      . vancomycin (VANCOCIN) 1,250 mg in sodium chloride 0.9 % 250 mL IVPB  1,250 mg Intravenous Q24H ElMargaretha SeedsMD        Medications Prior to Admission  Medication Sig Dispense Refill Last Dose  . albuterol (PROVENTIL) (2.5 MG/3ML) 0.083% nebulizer solution Take 3 mLs by nebulization every 6 (six) hours as needed.  3 09/09/2018 at Unknown time  . BREO ELLIPTA 200-25 MCG/INH AEPB Inhale 1 puff into the lungs daily.  11 09/09/2018 at Unknown time  . fluticasone (FLONASE) 50 MCG/ACT nasal spray Place 1 spray into both nostrils daily.  5 Past Week at Unknown time  . pantoprazole (PROTONIX) 40 MG tablet Take 40 mg by mouth 2 (two) times daily.  3 09/02/2018 at Unknown time  . VENTOLIN HFA 108 (90 Base) MCG/ACT inhaler Inhale 1-2 puffs into the lungs every 4 (four) hours as needed.  3 09/06/2018 at Unknown time    Family History  Problem Relation Age of Onset  . Diabetes Mother   . Alzheimer's disease Father      Review of Systems:   ROS Pertinent items are noted in HPI.      Physical Exam: BP (!) 144/58   Pulse (!) 141   Temp (!) 100.9 F (38.3 C)   Resp 20   Ht 5' 6"  (1.676 m)   Wt 86.2 kg   SpO2 91%   BMI 30.67 kg/m    Head: Normocephalic, without obvious abnormality, atraumatic Resp: Diminished at bases Cardio: Tachycardic Extremities: SCDs in place  Diagnostic Studies & Laboratory data:     Recent Radiology Findings:   Dg Chest Port 1 View  Result Date: 08/18/2018 CLINICAL DATA:  Respiratory distress, bleeding in lungs EXAM: PORTABLE CHEST 1 VIEW COMPARISON:  Portable exam 0804 hours compared to 08/16/2018 FINDINGS: Tip  of endotracheal tube projects 2.3 cm above carina. Nasogastric tube extends into stomach. Normal heart size, mediastinal contours, and pulmonary vascularity. Mixed airspace and interstitial infiltrates are identified in BILATERAL lower lobes, RIGHT middle lobe, and probably within lingula consistent with pneumonia, though ovulated hemorrhage could have a similar appearance. No pleural effusion or pneumothorax. Bones demineralized. IMPRESSION: Persistent extensive bibasilar pulmonary infiltrates consistent with pneumonia, though pulmonary hemorrhage could cause a similar appearance. Electronically Signed   By: MaLavonia Dana.D.   On: 08/18/2018 08:35   Dg Chest Portable 1 View  Result Date:  08/25/2018 CLINICAL DATA:  Initial evaluation for intubation. EXAM: PORTABLE CHEST 1 VIEW COMPARISON:  Prior radiograph from earlier the same day. FINDINGS: Endotracheal tube in place with tip position 2.5 cm above the carina. Cardiac and mediastinal silhouettes are grossly stable, and remain within normal limits. Lungs are hypoinflated. Extensive bibasilar infiltrates again seen, similar to previous. There has been interval development of a degree of underlying pulmonary interstitial congestion/edema. Small bilateral pleural effusions. No pneumothorax. Osseous structures unchanged. IMPRESSION: 1. Tip of endotracheal to well position 2.5 cm above the carina. 2. Low lung volumes with extensive bibasilar infiltrates, concerning for pneumonia, similar to previous. 3. Interval development of underlying mild pulmonary interstitial edema with small bilateral pleural effusions. Electronically Signed   By: Jeannine Boga M.D.   On: 08/20/2018 17:44   Dg Chest Portable 1 View  Result Date: 09/06/2018 CLINICAL DATA:  Shortness of breath EXAM: PORTABLE CHEST 1 VIEW COMPARISON:  None. FINDINGS: The heart size and mediastinal contours are within normal limits. Consolidation of bilateral lung bases are noted. No pleural effusion is  noted. There is no pulmonary edema. The visualized skeletal structures are unremarkable. IMPRESSION: Bibasilar pneumonias. Electronically Signed   By: Abelardo Diesel M.D.   On: 08/21/2018 15:13   Dg Abd Portable 1v  Result Date: 08/18/2018 CLINICAL DATA:  Orogastric tube placement EXAM: PORTABLE ABDOMEN - 1 VIEW COMPARISON:  None. FINDINGS: The tip of the orogastric tube projects over the stomach. The side port is at the gastroesophageal junction. Unchanged basilar pulmonary opacities. No dilated small bowel. IMPRESSION: Orogastric tube side port at the gastroesophageal junction. Recommend advancing by 7 centimeters. Electronically Signed   By: Ulyses Jarred M.D.   On: 08/18/2018 01:45     I have independently reviewed the above radiologic studies and discussed with the patient   Recent Lab Findings: Lab Results  Component Value Date   WBC 13.6 (H) 08/18/2018   HGB 8.5 (L) 08/18/2018   HCT 28.4 (L) 08/18/2018   PLT 217 08/18/2018   GLUCOSE 89 08/18/2018   ALT 31 08/15/2018   AST 31 09/08/2018   NA 140 08/18/2018   K 4.1 08/18/2018   CL 110 08/18/2018   CREATININE 2.21 (H) 08/18/2018   BUN 26 (H) 08/18/2018   CO2 26 08/18/2018   INR 1.24 09/07/2018    Assessment / Plan:   1.Massive hemoptysis-per Dr. Prescott Gum, he recommends Factor 7  be given as well as ask IR to evaluate for embolization. Patient critically ill so not surgical candidate at this time. 2. Acute renal failure-creatinine this am 2.21. Nephrology has been consulted 3. Pneumonia-on Cefepime and Vancomycin.  4. History of silicosis-on Pulmicort and Duobebs as well as Methylprednisone  Jacob Pinks Jacob Thomas 08/18/2018 9:58 AM  Patient examined, chest x-ray images all personally reviewed.  57 year old admitted with massive hemoptysis and acute respiratory failure.  The patient's family member was at bedside and reports that the patient has been in failing health from pulmonary disease for approximately 2 years[pulmonary  fibrosis].  Most recently he has had hemoptysis which has rapidly progressed to the current situation.  It is the patient's wish thru the family that in his situation comfort care only be provided.   Agree with above assessment by ID Jacob Thomas. Dahlia Byes MD

## 2018-08-18 NOTE — Procedures (Signed)
Arterial Catheter Insertion Procedure Note Jacob Thomas [id number] 02-Dec-1960  Procedure: Insertion of Arterial Catheter  Indications: Blood pressure monitoring and Frequent blood sampling  Procedure Details Consent: Unable to obtain consent because of emergent medical necessity. Time Out: Verified patient identification, verified procedure, site/side was marked, verified correct patient position, special equipment/implants available, medications/allergies/relevent history reviewed, required imaging and test results available.  Performed  Maximum sterile technique was used including antiseptics, cap, gloves, gown, hand hygiene, mask and sheet. Skin prep: Chlorhexidine; local anesthetic administered 20 gauge catheter was inserted into right radial artery using the Seldinger technique. ULTRASOUND GUIDANCE USED: NO Evaluation Blood flow good; BP tracing good. Complications: No apparent complications.   MD asked for STAT a-line d/t pt desat, s/p bronch now (see anesthesia note).    Jacob Thomas 08/18/2018

## 2018-08-18 NOTE — Progress Notes (Signed)
eLink Physician-Brief Progress Note Patient Name: Isaiha Asare DOB: July 01, 1961 MRN: [id number]   Date of Service  08/18/2018  HPI/Events of Note  Sinus Tachycardia - HR = 140.   eICU Interventions  Will bolus with 0.9 NaCl 1 liter over 1 hour now.      Intervention Category Major Interventions: Arrhythmia - evaluation and management  Hasna Stefanik Eugene 08/18/2018, 6:56 AM

## 2018-08-18 NOTE — Progress Notes (Signed)
Chief Complaint: Patient was seen in consultation today for hemoptysis at the request of Dr. Marshell Garfinkel  Referring Physician(s): *Dr. Marshell Garfinkel  History of Present Illness: Jacob Thomas is a 57 y.o. male with a past medical history of  asthma and silicosis who presented to [facility] ED with complaints of worsening shortness of breath, chills, and hemoptysis. 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 He is followed by a pulmonologist in [place], where he resides. The hemoptysis apparently started about 3 weeks ago. He was treated with Augmentin and steroids (probable pneumonia) for pulmonary infection and symptoms initially improved but have recently worsened.  Patient has ongoing active bleeding from the right lung. Repeat bronchoscopy perfumed by Dr. Vaughan Browner shows the bleeding is coming from the right mainstem. Endobronchial plug has been placed. IR is consulted to consider bronchial artery embolization. Chart, imaging, PMHx, meds, labs reviewed. AKI with Cr 2.2. Sister at bedside, wife currently driving to facilty.  Past Medical History:  Diagnosis Date  . Silicosis ([place])     History reviewed. No pertinent surgical history.  Allergies: Patient has no known allergies.  Medications:  Current Facility-Administered Medications:  .  0.9 %  sodium chloride infusion, , Intravenous, Continuous, Anders Simmonds, MD, Last Rate: 100 mL/hr at 08/18/18 0600 .  Place/Maintain arterial line, , , Until Discontinued **AND** 0.9 %  sodium chloride infusion, , Intra-arterial, PRN, Mannam, Praveen, MD .  acetaminophen (TYLENOL) solution 650 mg, 650 mg, Per Tube, Q6H PRN, Anders Simmonds, MD, 650 mg at 08/18/18 0152 .  albuterol (PROVENTIL) (2.5 MG/3ML) 0.083% nebulizer solution 2.5 mg, 2.5 mg, Nebulization, Q3H PRN, Anders Simmonds, MD .  budesonide (PULMICORT) nebulizer solution 0.25 mg, 0.25 mg, Nebulization,  BID, Margaretha Seeds, MD, 0.25 mg at 08/18/18 0853 .  [START ON 08/20/2018] ceFEPIme (MAXIPIME) 2 g in sodium chloride 0.9 % 100 mL IVPB, 2 g, Intravenous, Q24H, Margaretha Seeds, MD .  chlorhexidine gluconate (MEDLINE KIT) (PERIDEX) 0.12 % solution 15 mL, 15 mL, Mouth Rinse, BID, Margaretha Seeds, MD, 15 mL at 08/18/18 0815 .  desmopressin (DDAVP) 20 mcg in sodium chloride 0.9 % 50 mL IVPB, 20 mcg, Intravenous, Once, Mannam, Praveen, MD .  fentaNYL (SUBLIMAZE) bolus via infusion 50 mcg, 50 mcg, Intravenous, Q1H PRN, Mannam, Praveen, MD .  fentaNYL 2512mg in NS 2563m(1046mml) infusion-PREMIX, 25-400 mcg/hr, Intravenous, Continuous, Mannam, Praveen, MD .  ipratropium-albuterol (DUONEB) 0.5-2.5 (3) MG/3ML nebulizer solution 3 mL, 3 mL, Nebulization, Q6H, SomAnders SimmondsD, 3 mL at 08/18/18 0853 .  MEDLINE mouth rinse, 15 mL, Mouth Rinse, 10 times per day, EllMargaretha SeedsD, 15 mL at 08/18/18 0605 .  methylPREDNISolone sodium succinate (SOLU-MEDROL) 125 mg/2 mL injection 40 mg, 40 mg, Intravenous, Q12H, Mannam, Praveen, MD .  midazolam (VERSED) 50 mg in sodium chloride 0.9 % 50 mL (1 mg/mL) infusion, 0-10 mg/hr, Intravenous, Continuous, Mannam, Praveen, MD, Last Rate: 4 mL/hr at 08/18/18 0912, 4 mg/hr at 08/18/18 0912 .  midazolam (VERSED) bolus via infusion 1-2 mg, 1-2 mg, Intravenous, Q2H PRN, Mannam, Praveen, MD .  midazolam (VERSED) injection 4 mg, 4 mg, Intravenous, Once, Mannam, Praveen, MD .  norepinephrine (LEVOPHED) 4-5 MG/250ML-% infusion SOLN, , , ,  .  norepinephrine (LEVOPHED) 4mg46m D5W 250mL37mmix infusion, 0-40 mcg/min, Intravenous, Titrated, EllisMargaretha Seeds Stopped at 08/18/18 0018 .  pantoprazole (PROTONIX) injection 40 mg, 40 mg, Intravenous, Daily, EllisMargaretha Seeds  MD, 40 mg at 08/16/2018 2112 .  vancomycin (VANCOCIN) 1,250 mg in sodium chloride 0.9 % 250 mL IVPB, 1,250 mg, Intravenous, Q24H, Margaretha Seeds, MD    Family History  Problem Relation Age of  Onset  . Diabetes Mother   . Alzheimer's disease Father     Social History   Socioeconomic History  . Marital status: Married    Spouse name: Not on file  . Number of children: Not on file  . Years of education: Not on file  . Highest education level: Not on file  Occupational History  . Not on file  Social Needs  . Financial resource strain: Not on file  . Food insecurity:    Worry: Not on file    Inability: Not on file  . Transportation needs:    Medical: Not on file    Non-medical: Not on file  Tobacco Use  . Smoking status: Former Research scientist (life sciences)  . Smokeless tobacco: Never Used  Substance and Sexual Activity  . Alcohol use: Not Currently  . Drug use: Not on file  . Sexual activity: Not on file  Lifestyle  . Physical activity:    Days per week: Not on file    Minutes per session: Not on file  . Stress: Not on file  Relationships  . Social connections:    Talks on phone: Not on file    Gets together: Not on file    Attends religious service: Not on file    Active member of club or organization: Not on file    Attends meetings of clubs or organizations: Not on file    Relationship status: Not on file  Other Topics Concern  . Not on file  Social History Narrative  . Not on file    Review of Systems: A 12 point ROS discussed and pertinent positives are indicated in the HPI above.  All other systems are negative.  Review of Systems  Vital Signs: BP (!) 144/58   Pulse (!) 141   Temp (!) 100.9 F (38.3 C)   Resp 20   Ht 5' 6"  (1.676 m)   Wt 86.2 kg   SpO2 (!) 89%   BMI 30.67 kg/m   Physical Exam Pt intubated/sedated on vent. Slightly diminished BS on (R) Heart: Tachy reg, femoral pulses intact   Mallampati Score:  MD Evaluation Airway: Other (comments) Airway comments: Intubated Heart: WNL Abdomen: WNL Chest/ lungs comments: Diminished (R)BS ASA  Classification: 4 Mallampati/Airway Score: (Intubated)  Imaging: Dg Chest Port 1 View  Result Date:  08/18/2018 CLINICAL DATA:  Respiratory distress, bleeding in lungs EXAM: PORTABLE CHEST 1 VIEW COMPARISON:  Portable exam 0804 hours compared to 08/29/2018 FINDINGS: Tip of endotracheal tube projects 2.3 cm above carina. Nasogastric tube extends into stomach. Normal heart size, mediastinal contours, and pulmonary vascularity. Mixed airspace and interstitial infiltrates are identified in BILATERAL lower lobes, RIGHT middle lobe, and probably within lingula consistent with pneumonia, though ovulated hemorrhage could have a similar appearance. No pleural effusion or pneumothorax. Bones demineralized. IMPRESSION: Persistent extensive bibasilar pulmonary infiltrates consistent with pneumonia, though pulmonary hemorrhage could cause a similar appearance. Electronically Signed   By: Lavonia Dana M.D.   On: 08/18/2018 08:35   Dg Chest Portable 1 View  Result Date: 09/11/2018 CLINICAL DATA:  Initial evaluation for intubation. EXAM: PORTABLE CHEST 1 VIEW COMPARISON:  Prior radiograph from earlier the same day. FINDINGS: Endotracheal tube in place with tip position 2.5 cm above the carina. Cardiac and mediastinal silhouettes  are grossly stable, and remain within normal limits. Lungs are hypoinflated. Extensive bibasilar infiltrates again seen, similar to previous. There has been interval development of a degree of underlying pulmonary interstitial congestion/edema. Small bilateral pleural effusions. No pneumothorax. Osseous structures unchanged. IMPRESSION: 1. Tip of endotracheal to well position 2.5 cm above the carina. 2. Low lung volumes with extensive bibasilar infiltrates, concerning for pneumonia, similar to previous. 3. Interval development of underlying mild pulmonary interstitial edema with small bilateral pleural effusions. Electronically Signed   By: Jeannine Boga M.D.   On: 09/07/2018 17:44   Dg Chest Portable 1 View  Result Date: 09/11/2018 CLINICAL DATA:  Shortness of breath EXAM: PORTABLE CHEST 1  VIEW COMPARISON:  None. FINDINGS: The heart size and mediastinal contours are within normal limits. Consolidation of bilateral lung bases are noted. No pleural effusion is noted. There is no pulmonary edema. The visualized skeletal structures are unremarkable. IMPRESSION: Bibasilar pneumonias. Electronically Signed   By: Abelardo Diesel M.D.   On: 08/21/2018 15:13   Dg Abd Portable 1v  Result Date: 08/18/2018 CLINICAL DATA:  Orogastric tube placement EXAM: PORTABLE ABDOMEN - 1 VIEW COMPARISON:  None. FINDINGS: The tip of the orogastric tube projects over the stomach. The side port is at the gastroesophageal junction. Unchanged basilar pulmonary opacities. No dilated small bowel. IMPRESSION: Orogastric tube side port at the gastroesophageal junction. Recommend advancing by 7 centimeters. Electronically Signed   By: Ulyses Jarred M.D.   On: 08/18/2018 01:45    Labs:  CBC: Recent Labs    09/03/2018 1353 09/10/2018 1930 08/18/18 0500  WBC 14.7* 20.9* 13.6*  HGB 10.5* 7.4* 8.5*  HCT 34.7* 25.0* 28.4*  PLT 245 280 217    COAGS: Recent Labs    08/24/2018 1353 08/18/18 0737  INR 1.24  --   APTT  --  35    BMP: Recent Labs    09/06/2018 1930 08/18/18 0500  NA 139 140  K 3.9 4.1  CL 106 110  CO2 23 26  GLUCOSE 113* 89  BUN 30* 26*  CALCIUM 7.1* 7.5*  CREATININE 2.29* 2.21*  GFRNONAA 30* 31*  GFRAA 34* 36*    LIVER FUNCTION TESTS: Recent Labs    08/16/2018 1930  BILITOT 1.7*  AST 31  ALT 31  ALKPHOS 81  PROT 5.5*  ALBUMIN 2.2*    TUMOR MARKERS: No results for input(s): AFPTM, CEA, CA199, CHROMGRNA in the last 8760 hours.  Assessment and Plan: Persistent massive hemoptysis, source likely RML/RLL Endobronchial plug in place Discussed role of angiogram/embo with primary team as well as with wife. Specifically discussed risk of further injury to his renal function with contrast load. Also discussed risk of paralysis from non-target embo as well as treatment failure. She  understands procedure is essentially last resort to control bleeding at this time. She would like to arrive to the hospital and see her husband before further discussing and making decision to proceed. Risks and benefits of bronchial angio and embo were discussed with the patient including, but not limited to bleeding, infection, vascular injury or contrast induced renal failure.  This interventional procedure involves the use of X-rays and because of the nature of the planned procedure, it is possible that we will have prolonged use of X-ray fluoroscopy.  Potential radiation risks include (but are not limited to) the following: - A slightly elevated risk for cancer  several years later in life. This risk is typically less than 0.5% percent. This risk is low in comparison to  the normal incidence of human cancer, which is 33% for women and 50% for men according to the Page. - Radiation induced injury can include skin redness, resembling a rash, tissue breakdown / ulcers and hair loss (which can be temporary or permanent).   The likelihood of either of these occurring depends on the difficulty of the procedure and whether you are sensitive to radiation due to previous procedures, disease, or genetic conditions.   IF your procedure requires a prolonged use of radiation, you will be notified and given written instructions for further action.  It is your responsibility to monitor the irradiated area for the 2 weeks following the procedure and to notify your physician if you are concerned that you have suffered a radiation induced injury.    All of the patient's questions were answered, patient is agreeable to proceed.    Thank you for this interesting consult. .  A copy of this report was sent to the requesting provider on this date.  Electronically Signed: Ascencion Dike 08/18/2018, 10:33 AM   I spent a total of 40 minutes in face to face in clinical consultation, greater  than 50% of which was counseling/coordinating care for bronchial angio/embo

## 2018-08-18 NOTE — Consult Note (Signed)
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 Previously was prescribed Augmentin and steroid burst x5 days for similar symptoms 3 weeks ago with improvement in SOB. Lives in [place] and follows with Pulmonology there. Presented to [facility] ED for worsening SOB and hemoptysis and chills despite use of home albuterol and noted to meet sepsis criteria with tachycardic, tachypneic, with new onset hypoxemia. He was intubated for respiratory distress with collection of blood cultures, currently no growth <12 hours, and initiation of antibiotics. He was then admitted to [facility] ICU for bronchoscopy 11/5 with evidence of bloody secretions in R middle and lower lobes. BAL of RML collected. Then transferred to [facility] ICU 11/5 for potential embolization. Repeat bronch with bronchial blocker 11/6. Nephrology consulted for AKI in the setting for need for angiogram and bronchial artery embolization.  Past Medical History:  Diagnosis Date  . Silicosis ([place])     History reviewed. No pertinent surgical history.  Family History  Problem Relation Age of Onset  . Diabetes Mother   . Alzheimer's disease Father    Social History:  reports that he has quit smoking. He has never used smokeless tobacco. He reports that he drank alcohol. His drug history is not on file.  Allergies: No Known Allergies  Medications Prior to Admission  Medication Sig Dispense Refill  . albuterol (PROVENTIL) (2.5 MG/3ML)  0.083% nebulizer solution Take 3 mLs by nebulization every 6 (six) hours as needed.  3  . BREO ELLIPTA 200-25 MCG/INH AEPB Inhale 1 puff into the lungs daily.  11  . fluticasone (FLONASE) 50 MCG/ACT nasal spray Place 1 spray into both nostrils daily.  5  . pantoprazole (PROTONIX) 40 MG tablet Take 40 mg by mouth 2 (two) times daily.  3  . VENTOLIN HFA 108 (90 Base) MCG/ACT inhaler Inhale 1-2 puffs into the lungs every 4 (four) hours as needed.  3    Results for orders placed or performed during the hospital encounter of 08/16/2018 (from the past 48 hour(s))  Type and screen [place]     Status: None   Collection Time: 08/15/2018  1:49 PM  Result Value Ref Range   ABO/RH(D) B POS    Antibody Screen NEG    Sample Expiration      08/20/2018 Performed at [facility], [place] [address] [place], [place] [address]   CBC     Status: Abnormal   Collection Time: 08/22/2018  1:53 PM  Result Value Ref Range   WBC 14.7 (H) 4.0 - 10.5 K/uL   RBC 3.98 (L) 4.22 - 5.81 MIL/uL   Hemoglobin 10.5 (L) 13.0 - 17.0 g/dL   HCT  34.7 (L) 39.0 - 52.0 %   MCV 87.2 80.0 - 100.0 fL   MCH 26.4 26.0 - 34.0 pg   MCHC 30.3 30.0 - 36.0 g/dL   RDW 14.6 11.5 - 15.5 %   Platelets 245 150 - 400 K/uL   nRBC 0.0 0.0 - 0.2 %    Comment: Performed at [facility], [place] [address], [place], [place] [address]  Protime-INR     Status: Abnormal   Collection Time: 08/13/2018  1:53 PM  Result Value Ref Range   Prothrombin Time 15.5 (H) 11.4 - 15.2 seconds   INR 1.24     Comment: Performed at [facility], [place] [address] [place], [place] [address]  ABO/Rh     Status: None   Collection Time: 09/02/2018  1:53 PM  Result Value Ref Range   ABO/RH(D)      B POS Performed at [facility], [place] [address] [place], [place] [address]   Blood gas, arterial ([facility] & AP ONLY)     Status: Abnormal   Collection Time: 09/04/2018  3:18 PM  Result Value  Ref Range   O2 Content 6.0 L/min   Delivery systems NASAL CANNULA    pH, Arterial 7.480 (H) 7.350 - 7.450   pCO2 arterial 36.9 32.0 - 48.0 mmHg   pO2, Arterial 58.8 (L) 83.0 - 108.0 mmHg   Bicarbonate 27.2 20.0 - 28.0 mmol/L   Acid-Base Excess 3.9 (H) 0.0 - 2.0 mmol/L   O2 Saturation 90.7 %   Patient temperature 98.6    Collection site LEFT RADIAL    Drawn by [id number]    Sample type ARTERIAL DRAW    Allens test (pass/fail) PASS PASS    Comment: Performed at [facility], [place] [address], [place], [place] [address]  Blood culture (routine x 2)     Status: None (Preliminary result)   Collection Time: 09/09/2018  4:04 PM  Result Value Ref Range   Specimen Description      BLOOD RIGHT WRIST Performed at [place] [address] [place], [place] [address]    Special Requests      BOTTLES DRAWN AEROBIC AND ANAEROBIC Blood Culture results may not be optimal due to an inadequate volume of blood received in culture bottles Performed at [facility], [place] [address], [place], [place] [address]    Culture      NO GROWTH < 24 HOURS Performed at [place] [address], [place], [place] [address]    Report Status PENDING   I-Stat CG4 Lactic Acid, ED     Status: None   Collection Time: 08/30/2018  4:11 PM  Result Value Ref Range   Lactic Acid, Venous 1.51 0.5 - 1.9 mmol/L  Blood culture (routine x 2)     Status: None (Preliminary result)   Collection Time: 08/28/2018  4:13 PM  Result Value Ref Range   Specimen Description      BLOOD RIGHT ANTECUBITAL Performed at [place] [address], [place], [place] [address]    Special Requests      BOTTLES DRAWN AEROBIC AND ANAEROBIC Blood Culture adequate volume Performed at [place] [address] [place], [place] [address]    Culture      NO GROWTH < 24 HOURS Performed at [place] [address] [place], [place]  [address]    Report Status PENDING   Culture,  respiratory (non-expectorated)     Status: None (Preliminary result)   Collection Time: 08/14/2018  6:30 PM  Result Value Ref Range   Specimen Description      BRONCHIAL ALVEOLAR LAVAGE Performed at [facility] [address], [place], [place] [address]    Special Requests      NONE Performed at [facility], [place] [address], [place], [place] [address]    Gram Stain      RARE WBC PRESENT, PREDOMINANTLY PMN NO ORGANISMS SEEN    Culture      NO GROWTH < 24 HOURS Performed at [place] [address] [place], [place] [address]    Report Status PENDING   Blood gas, arterial     Status: Abnormal   Collection Time: 09/02/2018  7:08 PM  Result Value Ref Range   FIO2 100.00    Delivery systems VENTILATOR    Mode PRESSURE REGULATED VOLUME CONTROL    VT 510 mL   LHR 16 resp/min   Peep/cpap 5.0 cm H20   pH, Arterial 7.271 (L) 7.350 - 7.450   pCO2 arterial 55.2 (H) 32.0 - 48.0 mmHg   pO2, Arterial 58.9 (L) 83.0 - 108.0 mmHg   Bicarbonate 24.6 20.0 - 28.0 mmol/L   Acid-base deficit 1.9 0.0 - 2.0 mmol/L   O2 Saturation 85.7 %   Patient temperature 98.6    Collection site RIGHT RADIAL    Drawn by [id number]    Sample type ARTERIAL DRAW    Allens test (pass/fail) PASS PASS    Comment: Performed at [facility], [place] [address] [place], [place] [address]  Culture, blood (routine x 2)     Status: None (Preliminary result)   Collection Time: 09/04/2018  7:30 PM  Result Value Ref Range   Specimen Description      BLOOD LEFT ARM Performed at [place] [address], [place], [place] [address]    Special Requests      AEROBIC BOTTLE ONLY Blood Culture adequate volume Performed at [place] [address] [place], [place] [address]    Culture      NO GROWTH < 12 HOURS Performed at [place] [address] [place],   [address]    Report Status PENDING   Comprehensive metabolic panel     Status: Abnormal   Collection Time: 09/04/2018  7:30 PM  Result Value Ref Range   Sodium 139 135 - 145 mmol/L   Potassium 3.9 3.5 - 5.1 mmol/L   Chloride 106 98 - 111 mmol/L   CO2 23 22 - 32 mmol/L   Glucose, Bld 113 (H) 70 - 99 mg/dL   BUN 30 (H) 6 - 20 mg/dL   Creatinine, Ser 2.29 (H) 0.61 - 1.24 mg/dL   Calcium 7.1 (L) 8.9 - 10.3 mg/dL   Total Protein 5.5 (L) 6.5 - 8.1 g/dL   Albumin 2.2 (L) 3.5 - 5.0 g/dL   AST 31 15 - 41 U/L   ALT 31 0 - 44 U/L   Alkaline Phosphatase 81 38 - 126 U/L   Total Bilirubin 1.7 (H) 0.3 - 1.2 mg/dL   GFR calc non Af Amer 30 (L) >60 mL/min   GFR calc Af Amer 34 (L) >60 mL/min    Comment: (NOTE) The eGFR has been calculated using the CKD EPI equation. This calculation has not been validated in all clinical situations. eGFR's persistently <60 mL/min signify possible  Chronic Kidney Disease.    Anion gap 10 5 - 15    Comment: Performed at [facility], [place] [address] [place], [place] [address]  CBC with Differential/Platelet     Status: Abnormal   Collection Time: 09/07/2018  7:30 PM  Result Value Ref Range   WBC 20.9 (H) 4.0 - 10.5 K/uL   RBC 2.80 (L) 4.22 - 5.81 MIL/uL   Hemoglobin 7.4 (L) 13.0 - 17.0 g/dL    Comment: REPEATED TO VERIFY DELTA CHECK NOTED    HCT 25.0 (L) 39.0 - 52.0 %   MCV 89.3 80.0 - 100.0 fL   MCH 26.4 26.0 - 34.0 pg   MCHC 29.6 (L) 30.0 - 36.0 g/dL   RDW 14.7 11.5 - 15.5 %   Platelets 280 150 - 400 K/uL   nRBC 0.0 0.0 - 0.2 %   Neutrophils Relative % 80 %   Neutro Abs 16.5 (H) 1.7 - 7.7 K/uL   Lymphocytes Relative 9 %   Lymphs Abs 1.9 0.7 - 4.0 K/uL   Monocytes Relative 9 %   Monocytes Absolute 1.9 (H) 0.1 - 1.0 K/uL   Eosinophils Relative 0 %   Eosinophils Absolute 0.1 0.0 - 0.5 K/uL   Basophils Relative 0 %   Basophils Absolute 0.1 0.0 - 0.1 K/uL   Immature Granulocytes 2 %   Abs Immature Granulocytes 0.47 (H) 0.00 - 0.07 K/uL     Comment: Performed at [facility], [place] [address], [place], [place] [address]  Culture, blood (routine x 2)     Status: None (Preliminary result)   Collection Time: 08/20/2018  7:40 PM  Result Value Ref Range   Specimen Description      BLOOD LEFT ARM Performed at [place] [address] [place], [place] [address]    Special Requests      BOTTLES DRAWN AEROBIC AND ANAEROBIC Blood Culture adequate volume Performed at [place] [address], [place], [place] [address]    Culture      NO GROWTH < 12 HOURS Performed at [facility] [address], [place], [place] [address]    Report Status PENDING   MRSA PCR Screening     Status: None   Collection Time: 08/28/2018  8:09 PM  Result Value Ref Range   MRSA by PCR NEGATIVE NEGATIVE    Comment:        The GeneXpert MRSA Assay (FDA approved for NASAL specimens only), is one component of a comprehensive MRSA colonization surveillance program. It is not intended to diagnose MRSA infection nor to guide or monitor treatment for MRSA infections. Performed at [facility], [place] [address] [place], [place] [address]   Lactic acid, plasma     Status: None   Collection Time: 08/28/2018  8:22 PM  Result Value Ref Range   Lactic Acid, Venous 1.0 0.5 - 1.9 mmol/L    Comment: Performed at [facility], [place] [address], [place], [place] [address]  Glucose, capillary     Status: None   Collection Time: 09/11/2018 11:37 PM  Result Value Ref Range   Glucose-Capillary 74 70 - 99 mg/dL   Comment 1 Capillary Specimen   Glucose, capillary     Status: None   Collection Time: 08/18/18  3:53 AM  Result Value Ref Range   Glucose-Capillary 92 70 - 99 mg/dL   Comment 1 Capillary Specimen   CBC with Differential/Platelet  Status: Abnormal   Collection Time: 08/18/18  5:00 AM  Result Value Ref Range   WBC 13.6 (H) 4.0 - 10.5 K/uL   RBC 3.17 (L)  4.22 - 5.81 MIL/uL   Hemoglobin 8.5 (L) 13.0 - 17.0 g/dL   HCT 28.4 (L) 39.0 - 52.0 %   MCV 89.6 80.0 - 100.0 fL   MCH 26.8 26.0 - 34.0 pg   MCHC 29.9 (L) 30.0 - 36.0 g/dL   RDW 14.7 11.5 - 15.5 %   Platelets 217 150 - 400 K/uL   nRBC 0.0 0.0 - 0.2 %   Neutrophils Relative % 79 %   Neutro Abs 10.7 (H) 1.7 - 7.7 K/uL   Lymphocytes Relative 13 %   Lymphs Abs 1.8 0.7 - 4.0 K/uL   Monocytes Relative 6 %   Monocytes Absolute 0.8 0.1 - 1.0 K/uL   Eosinophils Relative 1 %   Eosinophils Absolute 0.2 0.0 - 0.5 K/uL   Basophils Relative 0 %   Basophils Absolute 0.1 0.0 - 0.1 K/uL   Immature Granulocytes 1 %   Abs Immature Granulocytes 0.12 (H) 0.00 - 0.07 K/uL    Comment: Performed at [facility], [address], [place], [place] [address]  Basic metabolic panel     Status: Abnormal   Collection Time: 08/18/18  5:00 AM  Result Value Ref Range   Sodium 140 135 - 145 mmol/L   Potassium 4.1 3.5 - 5.1 mmol/L   Chloride 110 98 - 111 mmol/L   CO2 26 22 - 32 mmol/L   Glucose, Bld 89 70 - 99 mg/dL   BUN 26 (H) 6 - 20 mg/dL   Creatinine, Ser 2.21 (H) 0.61 - 1.24 mg/dL   Calcium 7.5 (L) 8.9 - 10.3 mg/dL   GFR calc non Af Amer 31 (L) >60 mL/min   GFR calc Af Amer 36 (L) >60 mL/min    Comment: (NOTE) The eGFR has been calculated using the CKD EPI equation. This calculation has not been validated in all clinical situations. eGFR's persistently <60 mL/min signify possible Chronic Kidney Disease.    Anion gap 4 (L) 5 - 15    Comment: Performed at [place] [address], [place], [place] [address]  Type and screen [place]     Status: None   Collection Time: 08/18/18  5:08 AM  Result Value Ref Range   ABO/RH(D) B POS    Antibody Screen NEG    Sample Expiration      08/21/2018 Performed at [facility], [place] [address], [place], [place] [address]   ABO/Rh     Status: None   Collection Time: 08/18/18  5:08 AM  Result Value Ref Range   ABO/RH(D)       B POS Performed at [place] [address], [place], [place] [address]   APTT     Status: None   Collection Time: 08/18/18  7:37 AM  Result Value Ref Range   aPTT 35 24 - 36 seconds    Comment: Performed at [place] [address] [place], [place] [address]  Glucose, capillary     Status: None   Collection Time: 08/18/18  8:01 AM  Result Value Ref Range   Glucose-Capillary 73 70 - 99 mg/dL   Comment 1 Notify RN   Protime-INR     Status: Abnormal   Collection Time: 08/18/18 10:31 AM  Result Value Ref Range  Prothrombin Time 16.9 (H) 11.4 - 15.2 seconds   INR 1.39     Comment: Performed at [facility], [place] [address], [place], [place] [address]  Lactic acid, plasma     Status: None   Collection Time: 08/18/18 10:31 AM  Result Value Ref Range   Lactic Acid, Venous 0.7 0.5 - 1.9 mmol/L    Comment: Performed at [place] [address] [place], [place] [address]  I-STAT 3, arterial blood gas (G3+)     Status: Abnormal   Collection Time: 08/18/18 11:08 AM  Result Value Ref Range   pH, Arterial 7.112 (LL) 7.350 - 7.450   pCO2 arterial 75.7 (HH) 32.0 - 48.0 mmHg   pO2, Arterial 68.0 (L) 83.0 - 108.0 mmHg   Bicarbonate 24.0 20.0 - 28.0 mmol/L   TCO2 26 22 - 32 mmol/L   O2 Saturation 83.0 %   Acid-base deficit 6.0 (H) 0.0 - 2.0 mmol/L   Patient temperature 99.7 F    Collection site RADIAL, ALLEN'S TEST ACCEPTABLE    Sample type ARTERIAL    Dg Chest Port 1 View  Result Date: 08/18/2018 CLINICAL DATA:  Respiratory distress, bleeding in lungs EXAM: PORTABLE CHEST 1 VIEW COMPARISON:  Portable exam 0804 hours compared to 08/24/2018 FINDINGS: Tip of endotracheal tube projects 2.3 cm above carina. Nasogastric tube extends into stomach. Normal heart size, mediastinal contours, and pulmonary vascularity. Mixed airspace and interstitial infiltrates are identified in BILATERAL lower lobes, RIGHT middle lobe, and probably within lingula consistent with  pneumonia, though ovulated hemorrhage could have a similar appearance. No pleural effusion or pneumothorax. Bones demineralized. IMPRESSION: Persistent extensive bibasilar pulmonary infiltrates consistent with pneumonia, though pulmonary hemorrhage could cause a similar appearance. Electronically Signed   By: Lavonia Dana M.D.   On: 08/18/2018 08:35   Dg Chest Portable 1 View  Result Date: 09/11/2018 CLINICAL DATA:  Initial evaluation for intubation. EXAM: PORTABLE CHEST 1 VIEW COMPARISON:  Prior radiograph from earlier the same day. FINDINGS: Endotracheal tube in place with tip position 2.5 cm above the carina. Cardiac and mediastinal silhouettes are grossly stable, and remain within normal limits. Lungs are hypoinflated. Extensive bibasilar infiltrates again seen, similar to previous. There has been interval development of a degree of underlying pulmonary interstitial congestion/edema. Small bilateral pleural effusions. No pneumothorax. Osseous structures unchanged. IMPRESSION: 1. Tip of endotracheal to well position 2.5 cm above the carina. 2. Low lung volumes with extensive bibasilar infiltrates, concerning for pneumonia, similar to previous. 3. Interval development of underlying mild pulmonary interstitial edema with small bilateral pleural effusions. Electronically Signed   By: Jeannine Boga M.D.   On: 08/23/2018 17:44   Dg Chest Portable 1 View  Result Date: 08/24/2018 CLINICAL DATA:  Shortness of breath EXAM: PORTABLE CHEST 1 VIEW COMPARISON:  None. FINDINGS: The heart size and mediastinal contours are within normal limits. Consolidation of bilateral lung bases are noted. No pleural effusion is noted. There is no pulmonary edema. The visualized skeletal structures are unremarkable. IMPRESSION: Bibasilar pneumonias. Electronically Signed   By: Abelardo Diesel M.D.   On: 08/21/2018 15:13   Dg Abd Portable 1v  Result Date: 08/18/2018 CLINICAL DATA:  Orogastric tube placement EXAM: PORTABLE  ABDOMEN - 1 VIEW COMPARISON:  None. FINDINGS: The tip of the orogastric tube projects over the stomach. The side port is at the gastroesophageal junction. Unchanged basilar pulmonary opacities. No dilated small bowel. IMPRESSION: Orogastric tube side port at the gastroesophageal junction. Recommend advancing by 7 centimeters. Electronically  Signed   By: Ulyses Jarred M.D.   On: 08/18/2018 01:45   Review of Systems  Unable to perform ROS: Intubated   Blood pressure (!) 98/49, pulse (!) 130, temperature 99.7 F (37.6 C), resp. rate (!) 32, height _0  (1.676 m), weight 86.2 kg, SpO2 93 %. Physical Exam  Constitutional:  Critically ill appearing, intubated, sedated.  Cardiovascular: Regular rhythm.  No murmur heard. Tachycardic. No LE edema  Respiratory: He has no wheezes. He has no rales.  Intubated on mechanical ventilator. Decreased R sided breath sounds.  Skin: Skin is warm and dry. No rash noted.    Assessment/Plan 1. AKI w/o h/o CKD in the setting of sepsis - Cr 2.29>2.21. No prior documented baseline or prior imaging available in Care Everywhere, although assume normal given no h/o CKD and relatively good control of HTN outpt. No h/o DM or FH of HD. Lack of perfusion with sepsis likely contributing. Continuing on NS @ 124m/hr with good UOP, 6538mdocumented and full foley bag at bedside. Consider switching Vancomycin and avoiding other nephrotoxic agents. Given presumed normal baseline and critical condition requiring embolization, risk of not performing procedure likely outweighs risk of further insult to kidneys with contrast. Would recommend continued adequate IV hydration after contrast load. 2. Lower BP with h/o HTN - holding home antihypertensives. Requiring pressors for lower BP. 3. Electrolytes: K 4.1 Na 140 Co2 26 Cl 110 Calcium 7.5. Continue to monitor. 4. Normocytic Anemia in the setting of massive hemoptysis - Hgb 8.5, managed by CCM 5. Hemoptysis w/ ILD - per CCM, planning  for angiogram with bronchial artery embolization with IR.    AlRory PercyDO PGY2

## 2018-08-18 NOTE — Plan of Care (Signed)
  Problem: Clinical Measurements: Goal: Ability to maintain clinical measurements within normal limits will improve Outcome: Progressing Pt developed a fever of 103.1, tylenol was administered and fever is easing off during this shift.  Problem: Clinical Measurements: Goal: Respiratory complications will improve Outcome: Not Progressing  Pt continues to have copious bloody secretions coming from ET tube during the shift requiring frequent suctioning.

## 2018-08-18 NOTE — Procedures (Signed)
Central Venous Catheter Insertion Procedure Note Jacob Thomas [id number] 05-Nov-1960  Procedure: Insertion of Central Venous Catheter Indications: Assessment of intravascular volume, Drug and/or fluid administration and Frequent blood sampling  Procedure Details Consent: Risks of procedure as well as the alternatives and risks of each were explained to the (patient/caregiver).  Consent for procedure obtained. Time Out: Verified patient identification, verified procedure, site/side was marked, verified correct patient position, special equipment/implants available, medications/allergies/relevent history reviewed, required imaging and test results available.  Performed  Maximum sterile technique was used including antiseptics, cap, gloves, gown, hand hygiene, mask and sheet. Skin prep: Chlorhexidine; local anesthetic administered A antimicrobial bonded/coated triple lumen catheter was placed in the right internal jugular vein using the Seldinger technique. Ultrasound guidance used.Yes.   Catheter placed to 17 cm. Blood aspirated via all 3 ports and then flushed x 3. Line sutured x 2 and dressing applied.  Evaluation Blood flow good Complications: No apparent complications Patient did tolerate procedure well. Chest X-ray ordered to verify placement.  CXR: pending.  Brett Canales  ACNP [facility] PCCM Pager [phone] till 1 pm If no answer page [phone] 08/18/2018, 11:05 AM

## 2018-08-18 NOTE — Procedures (Signed)
Intubation Procedure Note Quay Simkin [id number] 19-Sep-1961  Procedure: Intubation Indications: Airway protection and maintenance  Date Peformed: 08/23/2018  Procedure Details Consent: Risks of procedure as well as the alternatives and risks of each were explained to the (patient/caregiver).  Consent for procedure obtained. Time Out: Verified patient identification, verified procedure, site/side was marked, verified correct patient position, special equipment/implants available, medications/allergies/relevent history reviewed, required imaging and test results available.  Performed  Drugs: Etomidate 20 mg and Succ 100 mg Glidescope with 3 blade Grade I view Tube passed through cords under direct visualization Placement confirmed with bilateral breath sounds, positive EtCO2 change and smoke in tube   Evaluation Hemodynamic Status: Transient hypotension treated with pressors; O2 sats: transiently fell during during procedure Patient's Current Condition: stable Complications: No apparent complications Patient did tolerate procedure well. Chest X-ray ordered to verify placement.  CXR: tube position acceptable.   Quyen Cutsforth Mechele Collin 08/18/2018

## 2018-08-18 NOTE — Progress Notes (Addendum)
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  At baseline, he has chronic dyspnea due to his silicosis and is on chronic prednisone and bronchodilators.  Is followed by a pulmonologist in [place].  Three weeks ago, he presented to the OSH ED with similar symptoms with intermittent small streaks of sputum for which he was prescribed Augmentin and prednisone 60 mg x 5 days.  His symptoms improved with treatment however he to need to have persistent hemoptysis.  In the last 3 days, he has had fatigue, chills, dizziness and increased volume of bright red bloody sputum, being able to fill up half a cup over 2 hours.  In the ED he was found with tachycardia, tachypnea, and new onset hypoxemia.  He was placed on 6 L O2 via [place].  Pulmonary/critical care consulted for admission for massive hemoptysis.  Past Medical History  Silicosis, asthma, reflux, obstructive sleep apnea  Significant Hospital Events   09/04/2018- Admitted to ICU for massive hemoptysis. Intubated, bronched Transferred to [facility] for potential embolization. 11/6- Unstable. Repeat bronch done. Placed endobronchial blocker on right with help from anesthesia.  Consults: date of consult/date signed off & final recs:  Pulmonary 11/5 IR 11/5 CVTS 11/6 Nephrology 11/6  Procedures (surgical and bedside):  Intubation 11/5>>> Bronchoscopy 11/5 Bronchoscopy, right endobronchial blocker 11/6  Significant Diagnostic Tests:  Bronchoscopy 11/5: Airway inspection performed.  All airways patent.  Mucosa normal.  No evidence of blood or bleeding on left side.  Right  middle lobe and right lower lobe with bright red bloody secretions, not able to visualize bleeding site.  Secretions are more prominent at the entry of the right middle lobe.  RML and RLL suctioned with evidence of bloody secretions.  BAL of right middle lobe performed with 60 cc of saline given and 22 cc collected.  Micro Data:  BAL of right middle lobe >>  Antimicrobials:  Vanc 11/5 >> Cefepime 11/5 >>  Subjective:  Called emergently to the bedside due to active bleeding from the ET tube, desaturations, tachycardia Placed right lung down.  Called anesthesia for help with placement of right endobronchial blocker for right lung isolation Tidal volume reduced to 5 cc/kg. A line placed  Patient remains unresponsive on the vent, with tachycardia  Objective   Blood pressure 108/62, pulse (!) 146, temperature (!) 102 F (38.9 C), resp. rate 20, height _0  (1.676 m), weight 86.2 kg, SpO2 (!) 87 %.    Vent Mode: PRVC FiO2 (%):  [100 %] 100 % Set Rate:  [16 bmp-20 bmp] 20 bmp Vt Set:  [500 mL-510 mL] 510 mL PEEP:  [5 cmH20-8 cmH20] 8 cmH20 Plateau Pressure:  [25 cmH20-27 cmH20] 27 cmH20   Intake/Output Summary (Last 24 hours) at 08/18/2018 0921 Last data filed at 08/18/2018 0600 Gross per 24 hour  Intake 3149.3 ml  Output 655 ml  Net 2494.3 ml   Filed Weights   08/23/2018 1652  Weight: 86.2 kg    Examination: Gen:   Moderate distress, well-developed HEENT:  EOMI, sclera anicteric Neck:     No masses; no thyromegaly, ETT Lungs:    Clear to auscultation bilaterally; normal respiratory  effort CV:         Regular rate and rhythm; no murmurs Abd:      + bowel sounds; soft, non-tender; no palpable masses, no distension Ext:    No edema; adequate peripheral perfusion Skin:      Warm and dry; no rash Neuro: Sedated, unresponsive.  Resolved Hospital Problem list     Assessment & Plan:   Massive hemoptysis Patient with recent respiratory infection associated with hemoptysis that  has progressed despite antibiotics and steroid burst.  Presented with new onset hypoxemia and worsening hemoptysis.  Patient with history of silicosis and severe asthma.    Has ongoing active bleeding from the right lung.  Repeat bronchoscopy shows fresh blood coming up from the right mainstem. We temporized with placement of endobronchial blocker.    I have discussed with IR who are hesitant to embolize given high creatinine.  They have requested nephrology and CVTS to consult If he continues to bleed then we will need to proceed with bronchial artery embolization after discussion of risk-benefit of the family.  Plan Placed left lung down to improve oxygenation as the bleeding right lung is now isolated. Keep endobronchial balloon blocker in place.  He is too unstable to undergo tube exchange for double-lumen ET tube. Reduce tidal volume 5 cc/kg for single lung ventilation Maintain plateau pressures below 30 Check ABG Consulted CVTS, IR Will discuss again with IR about embolization if there are no other options.  Acute renal failure Continue IV fluid hydration Nephrology consulted.  Pneumonia History of silicosis, asthma Treated as outpatient for pneumonia Continue broad-spectrum antibiotics.  Follow cultures  Goals of Care Discussed with patient 11/5 by admitting attending. He requests NO compressions, NO shocks. He is ok with mechanical ventilation, pressors and medications.  Disposition / Summary of Today's Plan 08/18/18   As above    Diet: NPO Pain/Anxiety/Delirium protocol (if indicated): Yes VAP protocol (if indicated): Yes DVT prophylaxis: SCDs GI prophylaxis: PPI (Home) Hyperglycemia protocol: N/A Mobility: Defer Code Status: DNR - NO compressions, NO shocks. Ok with mechanical ventilation, pressors and medications. Family Communication: Olegario Shearer (Wife). She is aware of patient's code status Discussed again on 11/6 with wife over the phone and sister at bedside.  The  patient is critically ill with multiple organ system failure and requires high complexity decision making for assessment and support, frequent evaluation and titration of therapies, advanced monitoring, review of radiographic studies and interpretation of complex data.   Critical Care Time devoted to patient care services, exclusive of separately billable procedures, described in this note is 60 minutes.   Marshell Garfinkel MD [facility] Pager [phone] If no answer or after 3pm call: [phone] 08/18/2018, 9:46 AM

## 2018-08-18 NOTE — Progress Notes (Signed)
This note also relates to the following rows which could not be included: Temp - Cannot attach notes to unvalidated device data Pulse Rate - Cannot attach notes to unvalidated device data BP - Cannot attach notes to unvalidated device data  Patient desated during bronchoscopy procedure. Sats are slowly increasing. MD is aware. RT will continue to monitor.

## 2018-08-19 LAB — URINE CULTURE: CULTURE: NO GROWTH

## 2018-08-20 ENCOUNTER — Telehealth: Payer: Self-pay | Admitting: Pulmonary Disease

## 2018-08-20 NOTE — Telephone Encounter (Signed)
Called and spoke with patients wife, she stated that she wanted to make sure that the cause of death was placed on patients death certificate as Silicosis as this is what he suffered from.   Dr. Isaiah Serge please advise on this, thank you.

## 2018-08-21 LAB — CULTURE, RESPIRATORY: CULTURE: NORMAL

## 2018-08-21 LAB — CULTURE, RESPIRATORY W GRAM STAIN

## 2018-08-22 LAB — CULTURE, BLOOD (ROUTINE X 2)
CULTURE: NO GROWTH
CULTURE: NO GROWTH
CULTURE: NO GROWTH
Culture: NO GROWTH
SPECIAL REQUESTS: ADEQUATE
SPECIAL REQUESTS: ADEQUATE
SPECIAL REQUESTS: ADEQUATE

## 2018-08-24 ENCOUNTER — Telehealth: Payer: Self-pay

## 2018-08-24 NOTE — Telephone Encounter (Signed)
On 08/24/18 I received a d/c from [facility] [employer] (original). The d/c is for cremation.  The patient is a patient of Doctor Mannam.   The d/c will be taken to Pulmonary Unit for signature.  On 08/25/18 I received the d/c back from Doctor Mannam.  I got the d/c ready and called the funeral home to let them know the d/c was mailed to vital records per the funeral home request. I also faxed a copy per the funeral home request.

## 2018-08-24 NOTE — Telephone Encounter (Signed)
There is another encounter from Mickie KayMichelle Callahan, MD from today, 11/12 stating she received a d/c from the funeral home. Due to Dr. Isaiah SergeMannam being the MD at the time of pt's death at the hospital, he is the one who will be handling the death certificate and the certificate will be taken to our pulmonary office for his signature.  Routing this encounter to Dr. Isaiah SergeMannam so he can be able to see the response from pt's wife in regards to the cause of death that she wants to have placed on the death certificate.

## 2018-08-25 NOTE — Telephone Encounter (Signed)
Called patients wife, unable to reach left message to give us a call back.  

## 2018-08-25 NOTE — Telephone Encounter (Signed)
Ok. Done 

## 2018-08-26 NOTE — Telephone Encounter (Signed)
Pt wife is calling back   [phone] or [phone]

## 2018-08-26 NOTE — Telephone Encounter (Signed)
Spoke with pt's wife and advised her that Dr. Isaiah SergeMannam did put the cause of death on his death certificate. She wanted to know how long an autopsy can take and I advised her it could take weeks and that the funeral home may know more about that process. Pt stated she would call them to check status. Nothing further is needed.

## 2018-09-12 NOTE — Progress Notes (Addendum)
Time of death pronounced at 0051 by this RN and Lincoln Brigham, RN. Family present at bedside. Chaplain paged and called to bedside per family wishes. All belongings returned to wife Venancio Chenier minus pts wedding ring which is still on pts left hand.  Caswell Corwin, RN 09/03/2018 1:00AM

## 2018-09-12 NOTE — Progress Notes (Signed)
Patient terminally extubated per MD order by RT. Endobrochial balloon plug deflated prior to extubation. Provided oral care upon extubated. Family present at bedside.  Caswell Corwin, RN 09/04/2018 12:15 AM

## 2018-09-12 NOTE — Progress Notes (Signed)
Responded to page for Pt passing and family request for prayer. Introduced myself to family at bedside, wife Chip Boer, pt sister, pt daughter and grandson.  Family welcomed me and asked if I would offer prayer of blessing for pt.  Family indicated pt was Catholic.  Offered prayer around bedside with family, thanking for pt life and blessing, and for support of family in time of grief.  Offered support and guidance on hospital procedures.  Family is from out of area, and will be calling in morning to find a funeral home locally to provide cremation services.  Got contact information for staff and relayed info.  Walked with family as they left the hospital. Family thanked me for my presence verbally and shook hands.

## 2018-09-12 NOTE — Discharge Summary (Addendum)
Physician Death Summary  Patient ID: Jacob Thomas MRN: [id number] DOB/AGE: [birth year] 57 y.o.  Admit date: 09/07/2018 Discharge date: 23-Aug-2018  Admission Diagnoses: Massive hemoptysis  Discharge Diagnoses:  Active Problems:   Massive hemoptysis   Acute respiratory failure with hypoxia (HCC) Interstitial lung disease Silicosis Asthma  Discharged Condition: Deceased  Hospital Course:  Mr. 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  At baseline, he has chronic dyspnea due to his silicosis and is on chronic prednisone and bronchodilators.  Is followed by a pulmonologist in [place].  Three weeks ago, he presented to the OSH ED with similar symptoms with intermittent small streaks of sputum for which he was prescribed Augmentin and prednisone 60 mg x 5 days.  His symptoms improved with treatment however he had persistent hemoptysis.  In the last 3 days, he has had fatigue, chills, dizziness and increased volume of bright red bloody sputum, being able to fill up half a cup over 2 hours.  In the ED he was found with tachycardia, tachypnea, and new onset hypoxemia.  He was placed on 6 L O2 via [place].  Pulmonary/critical care consulted for admission for massive hemoptysis.  He was intubated, bronched which showed bleeding from the right middle lobe, and right lower lobe. Transferred to [facility] for potential embolization. On the morning of 11//6 he became increasingly unstable with worsening hemoptysis, hypoxia, tachycardia.  Repeat bronchoscope was done and fresh blood noted coming up from the right mainstem bronchus and spilling over into the left side. Blood suctioned out of the main trachea. Unable to advance scope further given severe desaturations. We were able to place a right-sided endobronchial blocker with the help of  anesthesia.  Position of balloon in right main stem confirmed with the bronchoscope visualization.  IR consulted for embolization.  Cardiothoracic surgery and nephrology were also consulted.  His wife arrived from [place] and we had a family meeting with wife and sister and discussed clinical course. Per family he has severe interstitial lung disease and a declining course over the past few years with frequent exacerbations, poor quality of life. He told his wife 1 year ago that he would wants to be DNR  Explained to family to stop bleeding he would need IR embolization with high risk of kidney failure.  Even in best case scenario if we were to stop bleeding he would definitely not return back to baseline functional status.  Given his poor quality of life at baseline and previously expressed wishes his wife does not want to proceed with any aggressive care.    Wife informed us that they would want withdrawal as soon as stepdaughter arrives from [place]. Later that day per request of the family including wife, sister, daughter he was transitioned to comfort care and terminally extubated.  Consults: Interventional radiology, cardiothoracic surgery, nephrology   Signed: Micah Barnier 08/21/2018, 2:39 PM

## 2018-09-12 NOTE — Progress Notes (Signed)
This RN along with Nechama Guard wasted 30mg  morphine and 55mg  versed down the sink. Caswell Corwin, RN 08/24/2018 4:41 AM

## 2018-09-12 NOTE — Procedures (Signed)
Extubation Procedure Note  Patient Details:   Name: Jacob Thomas DOB: 02/28/1961 MRN: [id number]     Patient terminally extubated per MD order to RA with RN at bedside.  No complications noted.  Family at bedside with patient.  Vent end date: 09/10/2018 Vent end time: 0015  Phill Myron 09/09/2018, 12:17 AM

## 2018-09-12 DEATH — deceased

## 2018-09-16 LAB — FUNGUS CULTURE RESULT

## 2018-09-16 LAB — FUNGUS CULTURE WITH STAIN

## 2018-09-16 LAB — FUNGAL ORGANISM REFLEX

## 2018-09-30 LAB — ACID FAST CULTURE WITH REFLEXED SENSITIVITIES (MYCOBACTERIA)

## 2018-09-30 LAB — ACID FAST CULTURE WITH REFLEXED SENSITIVITIES: ACID FAST CULTURE - AFSCU3: NEGATIVE

## 2019-08-13 IMAGING — DX DG CHEST 1V PORT
1 series · 1 of 1 positions shown · non-contrast
Comparison: None.

CLINICAL DATA: Shortness of breath

EXAM:
PORTABLE CHEST 1 VIEW

[chest ap]
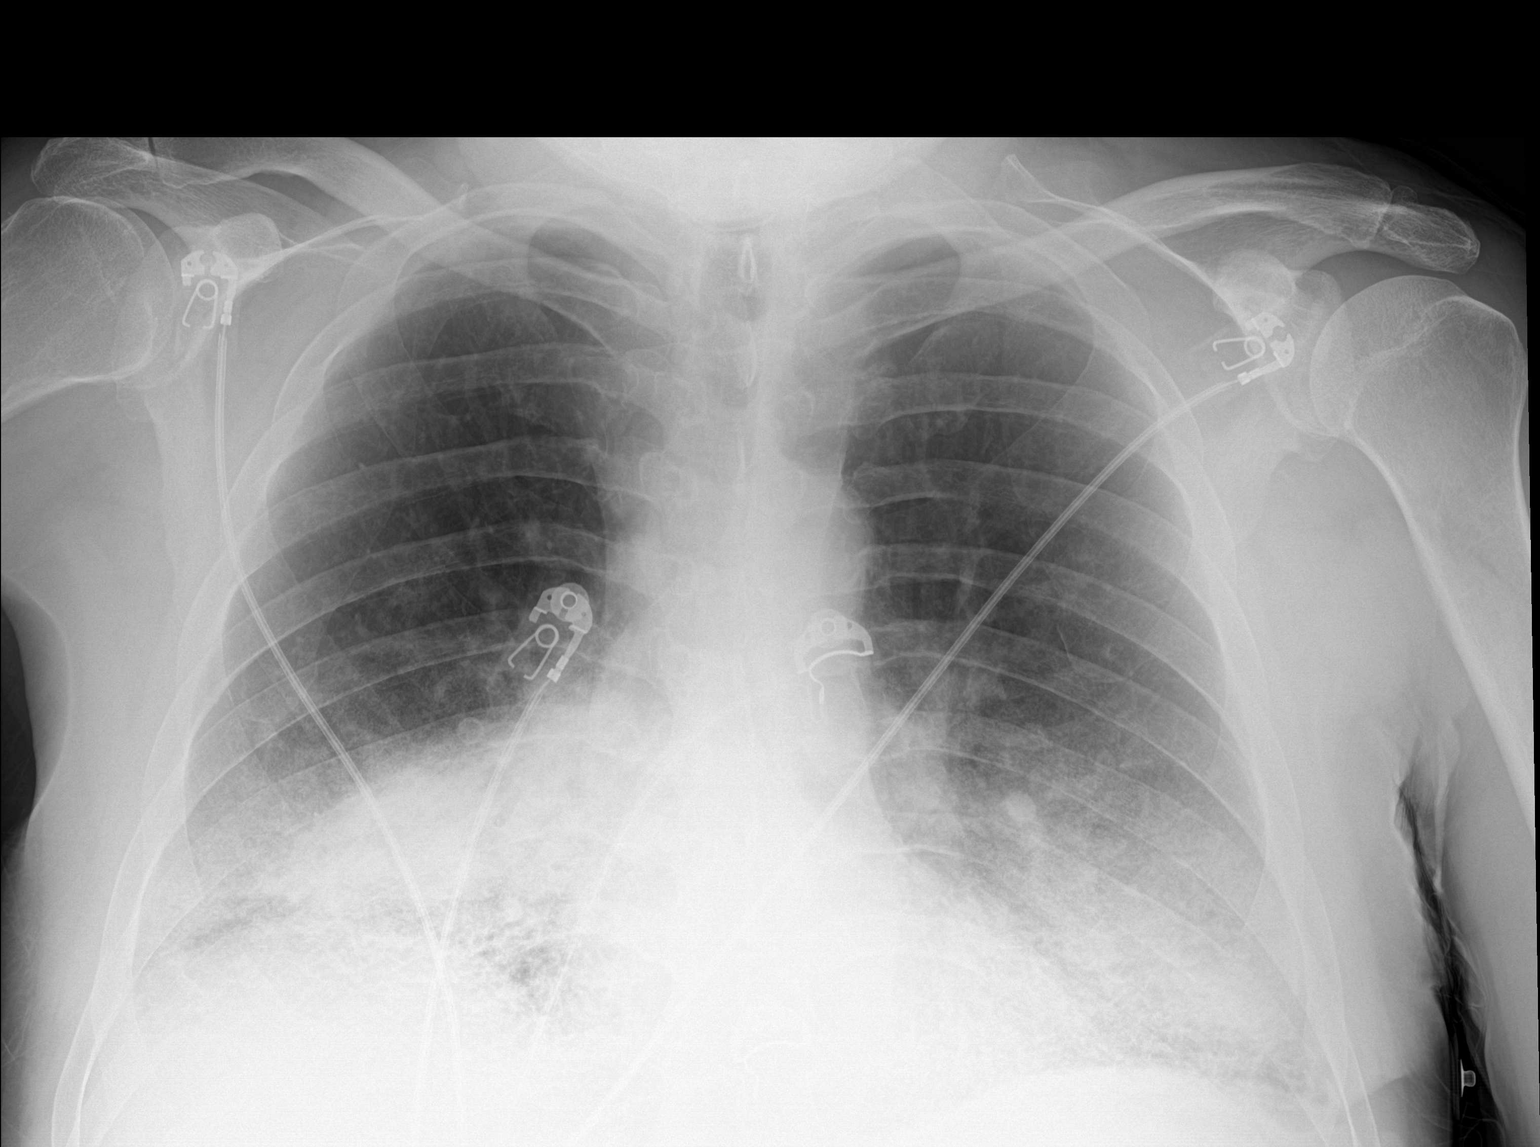

[1 of 1 positions shown; findings below may reference images not displayed]

FINDINGS: The heart size and mediastinal contours are within normal limits.
Consolidation of bilateral lung bases are noted. No pleural effusion
is noted. There is no pulmonary edema.. The visualized skeletal
structures are unremarkable.
IMPRESSION: Bibasilar pneumonias.

## 2019-08-13 IMAGING — DX DG CHEST 1V PORT
1 series · 1 of 1 positions shown · non-contrast
Comparison: Prior radiograph from earlier the same day.

CLINICAL DATA: Initial evaluation for intubation.

EXAM:
PORTABLE CHEST 1 VIEW

[chest ap]
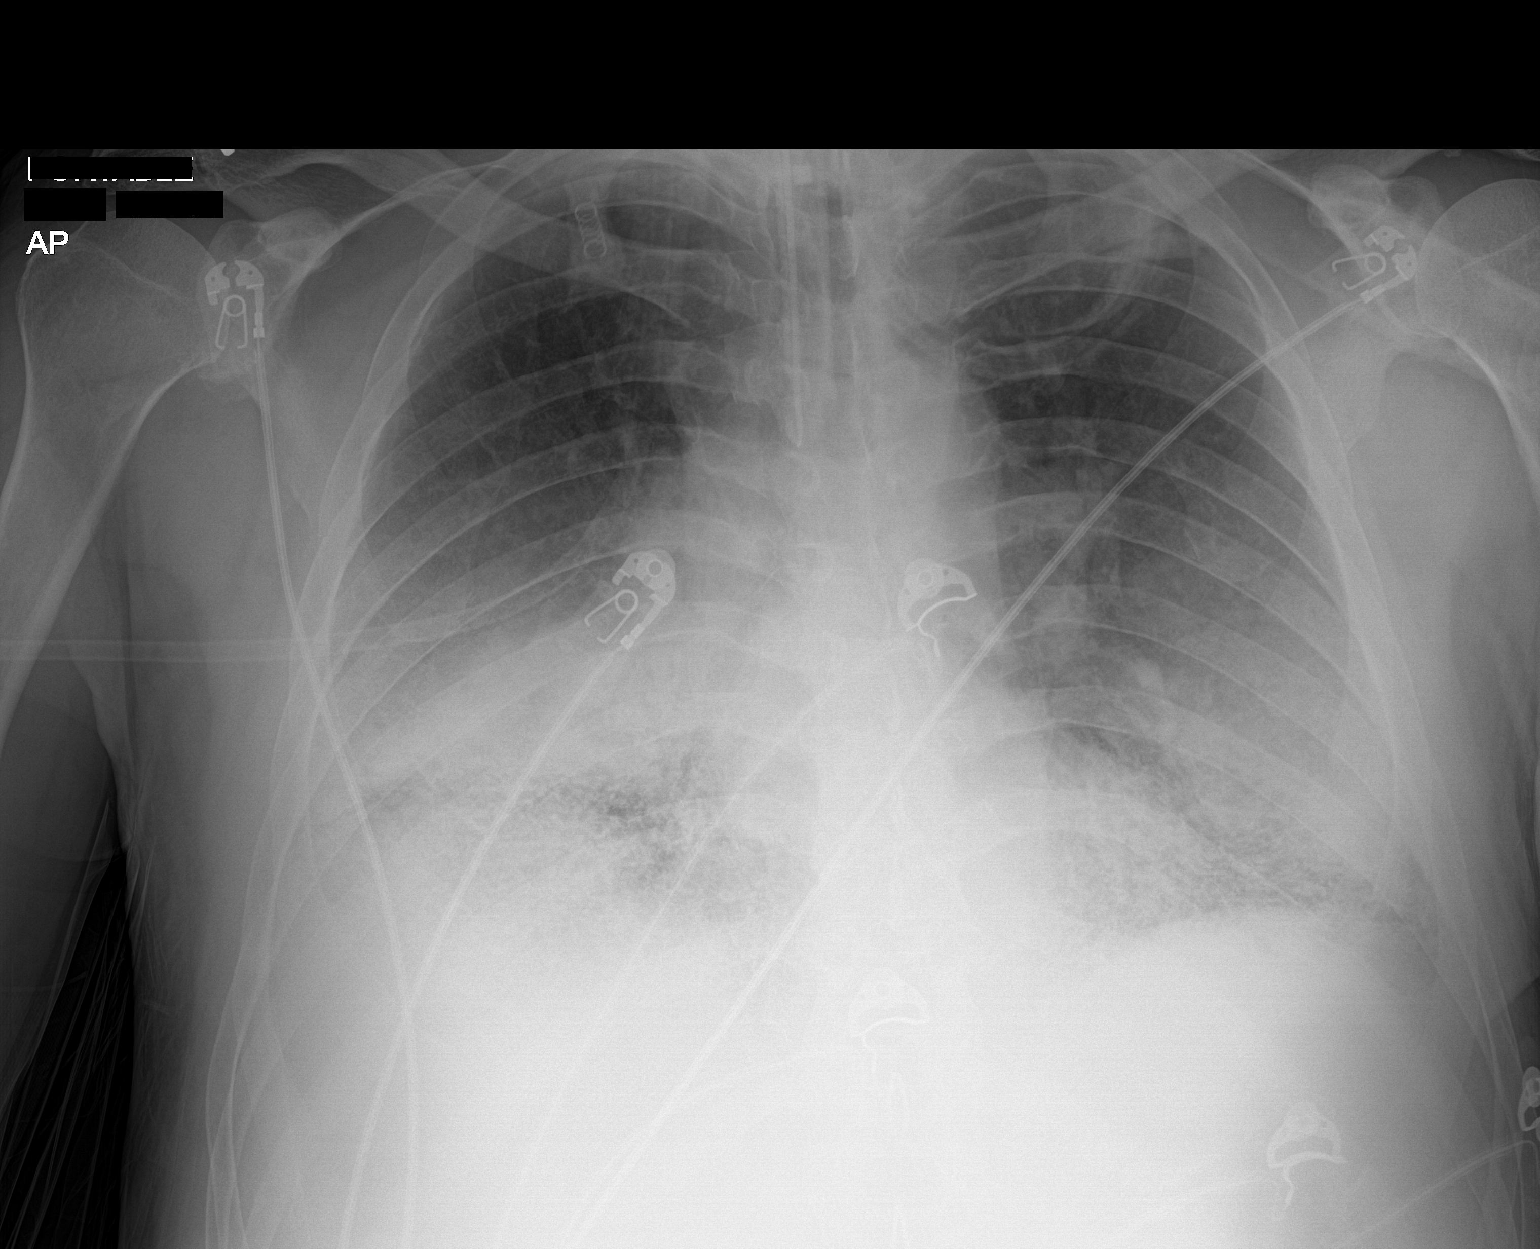

[1 of 1 positions shown; findings below may reference images not displayed]

FINDINGS: Endotracheal tube in place with tip position 2.5 cm above the
carina. Cardiac and mediastinal silhouettes are grossly stable, and
remain within normal limits.

Lungs are hypoinflated. Extensive bibasilar infiltrates again seen,
similar to previous. There has been interval development of a degree
of underlying pulmonary interstitial congestion/edema. Small
bilateral pleural effusions. No pneumothorax.

Osseous structures unchanged.
IMPRESSION: 1. Tip of endotracheal to well position 2.5 cm above the carina.
2. Low lung volumes with extensive bibasilar infiltrates, concerning
for pneumonia, similar to previous.
3. Interval development of underlying mild pulmonary interstitial
edema with small bilateral pleural effusions.

## 2019-08-14 IMAGING — DX DG ABD PORTABLE 1V
1 series · 1 of 1 positions shown · non-contrast
Comparison: None.

CLINICAL DATA: Orogastric tube placement

EXAM:
PORTABLE ABDOMEN - 1 VIEW

[abdomen]
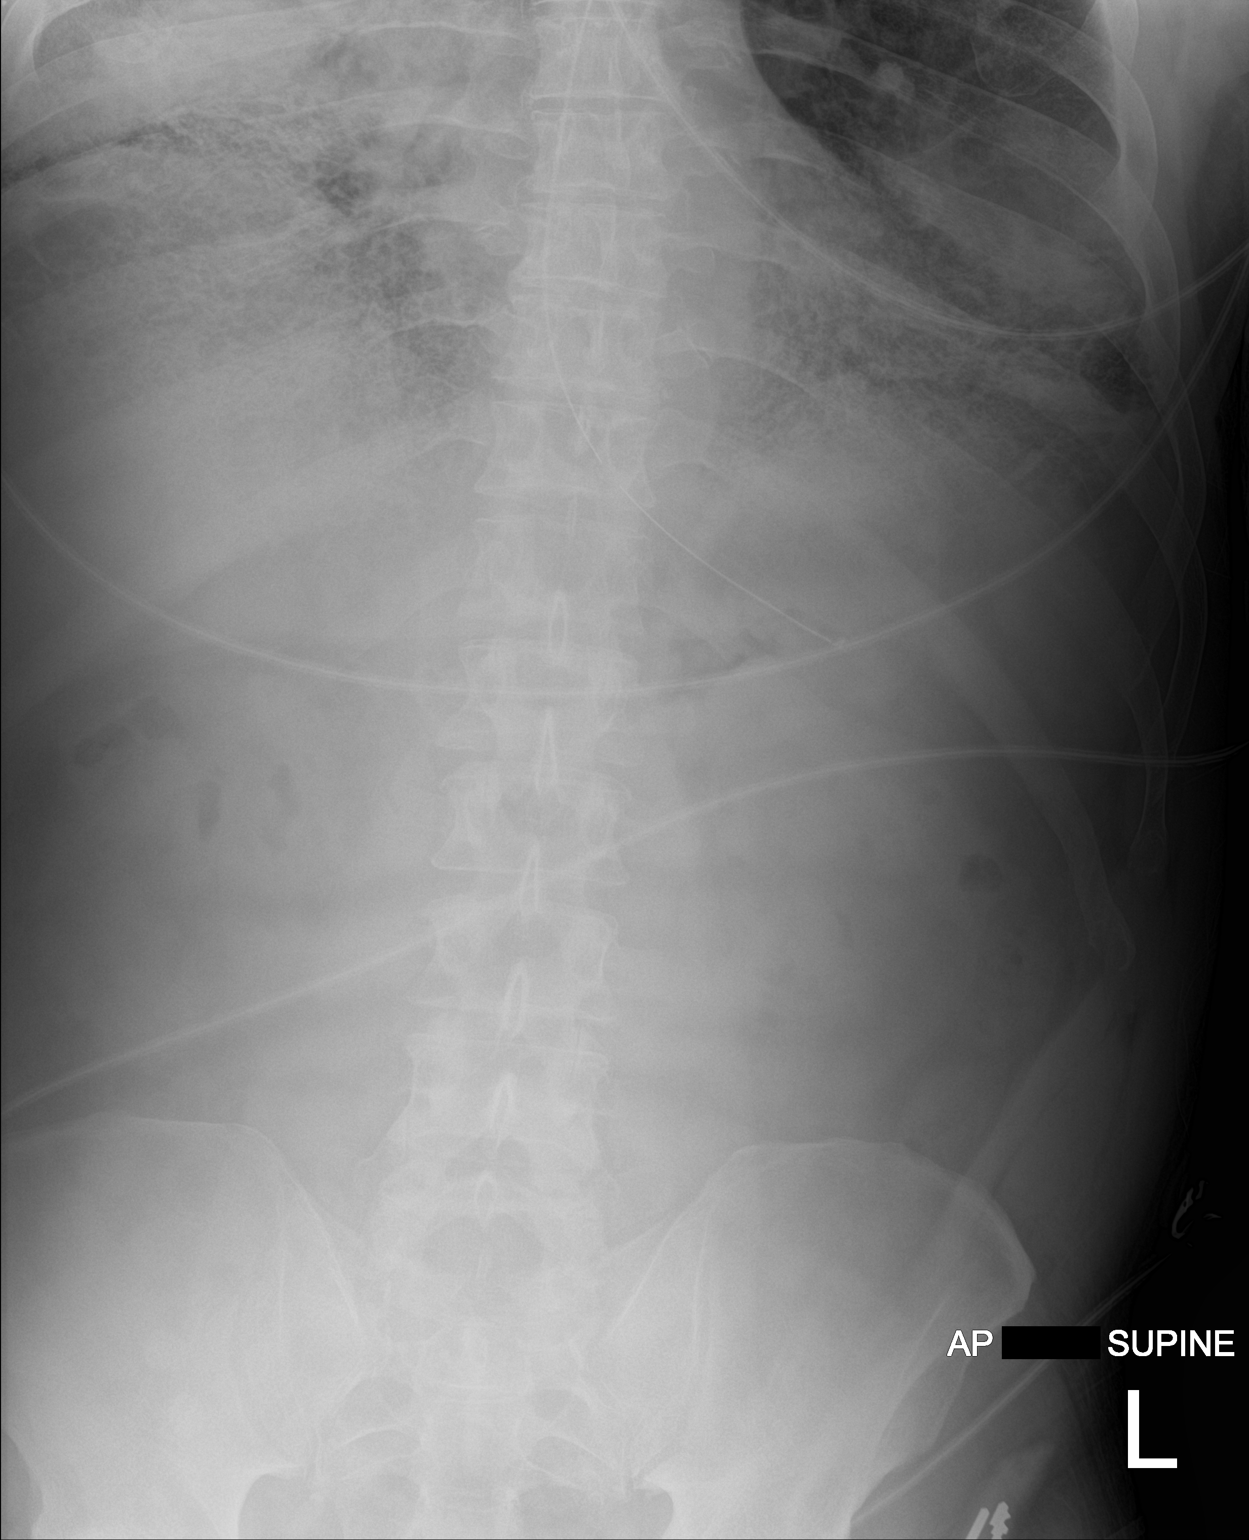

[1 of 1 positions shown; findings below may reference images not displayed]

FINDINGS: The tip of the orogastric tube projects over the stomach. The side
port is at the gastroesophageal junction. Unchanged basilar
pulmonary opacities. No dilated small bowel.
IMPRESSION: Orogastric tube side port at the gastroesophageal junction.
Recommend advancing by 7 centimeters.
